# Patient Record
Sex: Male | Born: 1940 | Race: Black or African American | Hispanic: No | Marital: Married | Smoking: Never smoker
Health system: Southern US, Community
[De-identification: ages and names within clinical notes are randomized; demographics above are authoritative.]

## PROBLEM LIST (undated history)

## (undated) DIAGNOSIS — E119 Type 2 diabetes mellitus without complications: Secondary | ICD-10-CM

## (undated) DIAGNOSIS — N400 Enlarged prostate without lower urinary tract symptoms: Secondary | ICD-10-CM

## (undated) DIAGNOSIS — I10 Essential (primary) hypertension: Secondary | ICD-10-CM

## (undated) DIAGNOSIS — E7800 Pure hypercholesterolemia, unspecified: Secondary | ICD-10-CM

## (undated) HISTORY — PX: HERNIA REPAIR: SHX51

## (undated) HISTORY — PX: SHOULDER SURGERY: SHX246

---

## 2006-05-04 ENCOUNTER — Ambulatory Visit: Payer: Self-pay | Admitting: Cardiology

## 2006-06-02 ENCOUNTER — Ambulatory Visit (HOSPITAL_COMMUNITY): Admission: RE | Admit: 2006-06-02 | Discharge: 2006-06-03 | Payer: Self-pay | Admitting: Specialist

## 2014-12-04 ENCOUNTER — Emergency Department (HOSPITAL_COMMUNITY)
Admission: EM | Admit: 2014-12-04 | Discharge: 2014-12-04 | Disposition: A | Discharge status: Home / Self Care | Payer: Medicare Other | Attending: Emergency Medicine | Admitting: Emergency Medicine

## 2014-12-04 ENCOUNTER — Encounter (HOSPITAL_COMMUNITY): Payer: Self-pay | Admitting: Emergency Medicine

## 2014-12-04 ENCOUNTER — Emergency Department (HOSPITAL_COMMUNITY): Payer: Medicare Other

## 2014-12-04 DIAGNOSIS — I10 Essential (primary) hypertension: Secondary | ICD-10-CM | POA: Insufficient documentation

## 2014-12-04 DIAGNOSIS — Z7982 Long term (current) use of aspirin: Secondary | ICD-10-CM | POA: Diagnosis not present

## 2014-12-04 DIAGNOSIS — E119 Type 2 diabetes mellitus without complications: Secondary | ICD-10-CM | POA: Insufficient documentation

## 2014-12-04 DIAGNOSIS — R05 Cough: Secondary | ICD-10-CM | POA: Diagnosis present

## 2014-12-04 DIAGNOSIS — J40 Bronchitis, not specified as acute or chronic: Secondary | ICD-10-CM

## 2014-12-04 DIAGNOSIS — Z87448 Personal history of other diseases of urinary system: Secondary | ICD-10-CM | POA: Diagnosis not present

## 2014-12-04 DIAGNOSIS — Z79899 Other long term (current) drug therapy: Secondary | ICD-10-CM | POA: Insufficient documentation

## 2014-12-04 DIAGNOSIS — J209 Acute bronchitis, unspecified: Secondary | ICD-10-CM | POA: Insufficient documentation

## 2014-12-04 HISTORY — DX: Benign prostatic hyperplasia without lower urinary tract symptoms: N40.0

## 2014-12-04 HISTORY — DX: Pure hypercholesterolemia, unspecified: E78.00

## 2014-12-04 HISTORY — DX: Essential (primary) hypertension: I10

## 2014-12-04 HISTORY — DX: Type 2 diabetes mellitus without complications: E11.9

## 2014-12-04 LAB — COMPREHENSIVE METABOLIC PANEL
ALK PHOS: 59 U/L (ref 38–126)
ALT: 16 U/L — AB (ref 17–63)
ANION GAP: 9 (ref 5–15)
AST: 22 U/L (ref 15–41)
Albumin: 4.1 g/dL (ref 3.5–5.0)
BILIRUBIN TOTAL: 0.5 mg/dL (ref 0.3–1.2)
BUN: 14 mg/dL (ref 6–20)
CALCIUM: 8.7 mg/dL — AB (ref 8.9–10.3)
CO2: 27 mmol/L (ref 22–32)
CREATININE: 1.04 mg/dL (ref 0.61–1.24)
Chloride: 102 mmol/L (ref 101–111)
GFR calc Af Amer: >60 mL/min (ref >60)
GFR calc non Af Amer: >60 mL/min (ref >60)
GLUCOSE: 124 mg/dL — AB (ref 65–99)
POTASSIUM: 3.6 mmol/L (ref 3.5–5.1)
Sodium: 138 mmol/L (ref 135–145)
Total Protein: 7.3 g/dL (ref 6.5–8.1)

## 2014-12-04 LAB — CBC WITH DIFFERENTIAL/PLATELET
BASOS PCT: 0 % (ref 0–1)
Basophils Absolute: 0 10*3/uL (ref 0.0–0.1)
EOS PCT: 2 % (ref 0–5)
Eosinophils Absolute: 0.2 10*3/uL (ref 0.0–0.7)
HCT: 38.4 % — ABNORMAL LOW (ref 39.0–52.0)
Hemoglobin: 12.6 g/dL — ABNORMAL LOW (ref 13.0–17.0)
LYMPHS ABS: 0.5 10*3/uL — AB (ref 0.7–4.0)
LYMPHS PCT: 5 % — AB (ref 12–46)
MCH: 29.6 pg (ref 26.0–34.0)
MCHC: 32.8 g/dL (ref 30.0–36.0)
MCV: 90.1 fL (ref 78.0–100.0)
MONO ABS: 0.5 10*3/uL (ref 0.1–1.0)
MONOS PCT: 5 % (ref 3–12)
NEUTROS PCT: 88 % — AB (ref 43–77)
Neutro Abs: 9.5 10*3/uL — ABNORMAL HIGH (ref 1.7–7.7)
Platelets: 260 10*3/uL (ref 150–400)
RBC: 4.26 MIL/uL (ref 4.22–5.81)
RDW: 13.6 % (ref 11.5–15.5)
WBC: 10.7 10*3/uL — ABNORMAL HIGH (ref 4.0–10.5)

## 2014-12-04 LAB — CBG MONITORING, ED: GLUCOSE-CAPILLARY: 123 mg/dL — AB (ref 65–99)

## 2014-12-04 MED ORDER — ACETAMINOPHEN 500 MG PO TABS
1000.0000 mg | ORAL_TABLET | Freq: Once | ORAL | Status: AC
  Administered 2014-12-04: 1000 mg via ORAL
  Filled 2014-12-04: qty 2

## 2014-12-04 MED ORDER — SODIUM CHLORIDE 0.9 % IV BOLUS (SEPSIS)
1000.0000 mL | Freq: Once | INTRAVENOUS | Status: AC
  Administered 2014-12-04: 1000 mL via INTRAVENOUS

## 2014-12-04 MED ORDER — LEVOFLOXACIN 500 MG PO TABS: 500.0000 mg | ORAL_TABLET | Freq: Every day | ORAL | Status: DC

## 2014-12-04 MED ORDER — LEVOFLOXACIN 500 MG PO TABS
500.0000 mg | ORAL_TABLET | Freq: Once | ORAL | Status: AC
  Administered 2014-12-04: 500 mg via ORAL
  Filled 2014-12-04: qty 1

## 2014-12-04 NOTE — Discharge Instructions (Signed)
Drink plenty of fluids.  Tylenol or motrin for fever.  Follow up with your md in 2-3 days for recheck

## 2014-12-04 NOTE — ED Notes (Signed)
Pt states that he has been running a fever and coughing since last night.  No meds taken since 1000.

## 2014-12-04 NOTE — ED Provider Notes (Signed)
CSN: [id number]     Arrival date & time 5/Wayne/16  1559 History   First MD Initiated Contact with Patient 05/Wayne/16 1608     Chief Complaint  Patient presents with  . Fever  . Cough     (Consider location/radiation/quality/duration/timing/severity/associated sxs/prior Treatment) Patient is a 74 y.o. Calhoun presenting with fever and cough. The history is provided by the patient (the pt complains of cough and fever for 1-2 days).  Fever Temp source:  Oral Severity:  Moderate Onset quality:  Sudden Timing:  Unable to specify Chronicity:  New Relieved by:  None tried Worsened by:  Nothing tried Associated symptoms: cough   Associated symptoms: no chest pain, no congestion, no diarrhea, no headaches and no rash   Cough Associated symptoms: fever   Associated symptoms: no chest pain, no eye discharge, no headaches and no rash     Past Medical History  Diagnosis Date  . Diabetes mellitus without complication   . Hypertension   . Hypercholesteremia   . BPH (benign prostatic hyperplasia)    History reviewed. No pertinent past surgical history. History reviewed. No pertinent family history. History  Substance Use Topics  . Smoking status: Never Smoker   . Smokeless tobacco: Not on file  . Alcohol Use: No    Review of Systems  Constitutional: Positive for fever. Negative for appetite change and fatigue.  HENT: Negative for congestion, ear discharge and sinus pressure.   Eyes: Negative for discharge.  Respiratory: Positive for cough.   Cardiovascular: Negative for chest pain.  Gastrointestinal: Negative for abdominal pain and diarrhea.  Genitourinary: Negative for frequency and hematuria.  Musculoskeletal: Negative for back pain.  Skin: Negative for rash.  Neurological: Negative for seizures and headaches.  Psychiatric/Behavioral: Negative for hallucinations.      Allergies  Review of patient's allergies indicates no known allergies.  Home Medications   Prior to  Admission medications   Medication Sig Start Date End Date Taking? Authorizing Provider  aspirin EC 81 MG tablet Take 81 mg by mouth daily.   Yes Historical Provider, MD  B Complex Vitamins (B COMPLEX PO) Take 1 tablet by mouth daily.   Yes Historical Provider, MD  doxazosin (CARDURA) 4 MG tablet Take 4 mg by mouth at bedtime.   Yes Historical Provider, MD  glipiZIDE (GLUCOTROL XL) 2.5 MG 24 hr tablet Take 2.5 mg by mouth daily with breakfast.   Yes Historical Provider, MD  losartan-hydrochlorothiazide (HYZAAR) 100-25 MG per tablet Take 1 tablet by mouth daily.   Yes Historical Provider, MD  metFORMIN (GLUCOPHAGE-XR) 500 MG 24 hr tablet Take 2,000 mg by mouth daily.   Yes Historical Provider, MD  omeprazole (PRILOSEC) 20 MG capsule Take 20 mg by mouth daily.   Yes Historical Provider, MD  polyethylene glycol powder (GLYCOLAX/MIRALAX) powder Take 17 g by mouth daily as needed. FOR CONSTIPATION 10/02/14  Yes Historical Provider, MD  Tetrahydrozoline HCl (EYE DROPS OP) Apply 1 drop to eye daily as needed (FOR DRY EYE/IRRITATION).   Yes Historical Provider, MD  levofloxacin (LEVAQUIN) 500 MG tablet Take 1 tablet (500 mg total) by mouth daily. 5/Wayne/16   Bethann Berkshire, MD   BP 159/65 mmHg  Pulse 99  Temp(Src) 102.6 F (39.2 C) (Oral)  Resp Wayne  Ht  (1.651 m)  Wt 159 lb (72.122 kg)  BMI 26.46 kg/m2  SpO2 96% Physical Exam  Constitutional: He is oriented to person, place, and time. He appears well-developed.  HENT:  Head: Normocephalic.  Eyes: Conjunctivae and  EOM are normal. No scleral icterus.  Neck: Neck supple. No thyromegaly present.  Cardiovascular: Normal rate and regular rhythm.  Exam reveals no gallop and no friction rub.   No murmur heard. Pulmonary/Chest: No stridor. He has no wheezes. He has no rales. He exhibits no tenderness.  Abdominal: He exhibits no distension. There is no tenderness. There is no rebound.  Musculoskeletal: Normal range of motion. He exhibits no edema.   Lymphadenopathy:    He has no cervical adenopathy.  Neurological: He is oriented to person, place, and time. He exhibits normal muscle tone. Coordination normal.  Skin: No rash noted. No erythema.  Psychiatric: He has a normal mood and affect. His behavior is normal.    ED Course  Procedures (including critical care time) Labs Review Labs Reviewed  CBC WITH DIFFERENTIAL/PLATELET - Abnormal; Notable for the following:    WBC 10.7 (*)    Hemoglobin 12.6 (*)    HCT 38.4 (*)    Neutrophils Relative % 88 (*)    Neutro Abs 9.5 (*)    Lymphocytes Relative 5 (*)    Lymphs Abs 0.5 (*)    All other components within normal limits  COMPREHENSIVE METABOLIC PANEL - Abnormal; Notable for the following:    Glucose, Bld 124 (*)    Calcium 8.7 (*)    ALT 16 (*)    All other components within normal limits  CBG MONITORING, ED - Abnormal; Notable for the following:    Glucose-Capillary 123 (*)    All other components within normal limits    Imaging Review Dg Chest 2 View  5/Wayne/2016   CLINICAL DATA:  Weakness, body aches and productive cough with fever since yesterday. History of diabetes and hypertension.  EXAM: CHEST  2 VIEW  COMPARISON:  07/21/2014  FINDINGS: Cardiac silhouette normal in size. Normal mediastinal and hilar contours.  Lungs are mildly hyperexpanded but clear. No pleural effusion or pneumothorax.  Bony thorax is demineralized but grossly intact.  IMPRESSION: No active cardiopulmonary disease.   Electronically Signed   By: Amie Portlandavid  Ormond M.D.   On: 05/Wayne/2016 16:51     EKG Interpretation None      MDM   Final diagnoses:  Bronchitis    Bronchitis with fever.  tx with levaquin and follow up     Bethann BerkshireJoseph Roselynn Whitacre, MD 05/Wayne/16 [phone]

## 2014-12-04 NOTE — ED Notes (Signed)
MD Zammit at bedside. 

## 2015-08-24 DIAGNOSIS — M5432 Sciatica, left side: Secondary | ICD-10-CM | POA: Diagnosis not present

## 2015-08-30 DIAGNOSIS — M5440 Lumbago with sciatica, unspecified side: Secondary | ICD-10-CM | POA: Diagnosis not present

## 2015-08-30 DIAGNOSIS — S39012D Strain of muscle, fascia and tendon of lower back, subsequent encounter: Secondary | ICD-10-CM | POA: Diagnosis not present

## 2015-08-30 DIAGNOSIS — X58XXXD Exposure to other specified factors, subsequent encounter: Secondary | ICD-10-CM | POA: Diagnosis not present

## 2015-09-04 DIAGNOSIS — X58XXXD Exposure to other specified factors, subsequent encounter: Secondary | ICD-10-CM | POA: Diagnosis not present

## 2015-09-04 DIAGNOSIS — M5440 Lumbago with sciatica, unspecified side: Secondary | ICD-10-CM | POA: Diagnosis not present

## 2015-09-04 DIAGNOSIS — S39012D Strain of muscle, fascia and tendon of lower back, subsequent encounter: Secondary | ICD-10-CM | POA: Diagnosis not present

## 2015-09-05 DIAGNOSIS — E1122 Type 2 diabetes mellitus with diabetic chronic kidney disease: Secondary | ICD-10-CM | POA: Diagnosis not present

## 2015-09-05 DIAGNOSIS — H524 Presbyopia: Secondary | ICD-10-CM | POA: Diagnosis not present

## 2015-09-05 DIAGNOSIS — N182 Chronic kidney disease, stage 2 (mild): Secondary | ICD-10-CM | POA: Diagnosis not present

## 2015-09-05 DIAGNOSIS — Z0100 Encounter for examination of eyes and vision without abnormal findings: Secondary | ICD-10-CM | POA: Diagnosis not present

## 2015-09-05 DIAGNOSIS — I10 Essential (primary) hypertension: Secondary | ICD-10-CM | POA: Diagnosis not present

## 2015-09-05 DIAGNOSIS — E782 Mixed hyperlipidemia: Secondary | ICD-10-CM | POA: Diagnosis not present

## 2015-09-12 DIAGNOSIS — G4733 Obstructive sleep apnea (adult) (pediatric): Secondary | ICD-10-CM | POA: Diagnosis not present

## 2015-09-12 DIAGNOSIS — I10 Essential (primary) hypertension: Secondary | ICD-10-CM | POA: Diagnosis not present

## 2015-09-12 DIAGNOSIS — Z122 Encounter for screening for malignant neoplasm of respiratory organs: Secondary | ICD-10-CM | POA: Diagnosis not present

## 2015-09-12 DIAGNOSIS — E1122 Type 2 diabetes mellitus with diabetic chronic kidney disease: Secondary | ICD-10-CM | POA: Diagnosis not present

## 2015-09-12 DIAGNOSIS — E782 Mixed hyperlipidemia: Secondary | ICD-10-CM | POA: Diagnosis not present

## 2015-09-12 DIAGNOSIS — Z0001 Encounter for general adult medical examination with abnormal findings: Secondary | ICD-10-CM | POA: Diagnosis not present

## 2015-09-12 DIAGNOSIS — J301 Allergic rhinitis due to pollen: Secondary | ICD-10-CM | POA: Diagnosis not present

## 2015-09-12 DIAGNOSIS — N400 Enlarged prostate without lower urinary tract symptoms: Secondary | ICD-10-CM | POA: Diagnosis not present

## 2015-11-22 DIAGNOSIS — R350 Frequency of micturition: Secondary | ICD-10-CM | POA: Diagnosis not present

## 2015-11-22 DIAGNOSIS — M545 Low back pain: Secondary | ICD-10-CM | POA: Diagnosis not present

## 2016-01-17 DIAGNOSIS — E782 Mixed hyperlipidemia: Secondary | ICD-10-CM | POA: Diagnosis not present

## 2016-01-17 DIAGNOSIS — E1122 Type 2 diabetes mellitus with diabetic chronic kidney disease: Secondary | ICD-10-CM | POA: Diagnosis not present

## 2016-01-17 DIAGNOSIS — E119 Type 2 diabetes mellitus without complications: Secondary | ICD-10-CM | POA: Diagnosis not present

## 2016-01-17 DIAGNOSIS — N182 Chronic kidney disease, stage 2 (mild): Secondary | ICD-10-CM | POA: Diagnosis not present

## 2016-01-17 DIAGNOSIS — I10 Essential (primary) hypertension: Secondary | ICD-10-CM | POA: Diagnosis not present

## 2016-01-22 DIAGNOSIS — G4733 Obstructive sleep apnea (adult) (pediatric): Secondary | ICD-10-CM | POA: Diagnosis not present

## 2016-01-22 DIAGNOSIS — J301 Allergic rhinitis due to pollen: Secondary | ICD-10-CM | POA: Diagnosis not present

## 2016-01-22 DIAGNOSIS — M545 Low back pain: Secondary | ICD-10-CM | POA: Diagnosis not present

## 2016-01-22 DIAGNOSIS — I10 Essential (primary) hypertension: Secondary | ICD-10-CM | POA: Diagnosis not present

## 2016-01-22 DIAGNOSIS — E782 Mixed hyperlipidemia: Secondary | ICD-10-CM | POA: Diagnosis not present

## 2016-01-22 DIAGNOSIS — Z6829 Body mass index (BMI) 29.0-29.9, adult: Secondary | ICD-10-CM | POA: Diagnosis not present

## 2016-01-22 DIAGNOSIS — N400 Enlarged prostate without lower urinary tract symptoms: Secondary | ICD-10-CM | POA: Diagnosis not present

## 2016-01-22 DIAGNOSIS — E1122 Type 2 diabetes mellitus with diabetic chronic kidney disease: Secondary | ICD-10-CM | POA: Diagnosis not present

## 2016-01-28 DIAGNOSIS — M79604 Pain in right leg: Secondary | ICD-10-CM | POA: Diagnosis not present

## 2016-01-28 DIAGNOSIS — M5126 Other intervertebral disc displacement, lumbar region: Secondary | ICD-10-CM | POA: Diagnosis not present

## 2016-01-28 DIAGNOSIS — M79605 Pain in left leg: Secondary | ICD-10-CM | POA: Diagnosis not present

## 2016-01-28 DIAGNOSIS — M545 Low back pain: Secondary | ICD-10-CM | POA: Diagnosis not present

## 2016-02-01 DIAGNOSIS — N200 Calculus of kidney: Secondary | ICD-10-CM | POA: Diagnosis not present

## 2016-02-01 DIAGNOSIS — K5732 Diverticulitis of large intestine without perforation or abscess without bleeding: Secondary | ICD-10-CM | POA: Diagnosis not present

## 2016-02-01 DIAGNOSIS — R1084 Generalized abdominal pain: Secondary | ICD-10-CM | POA: Diagnosis not present

## 2016-02-01 DIAGNOSIS — K4090 Unilateral inguinal hernia, without obstruction or gangrene, not specified as recurrent: Secondary | ICD-10-CM | POA: Diagnosis not present

## 2016-02-04 DIAGNOSIS — N182 Chronic kidney disease, stage 2 (mild): Secondary | ICD-10-CM | POA: Diagnosis not present

## 2016-02-04 DIAGNOSIS — I10 Essential (primary) hypertension: Secondary | ICD-10-CM | POA: Diagnosis not present

## 2016-02-05 DIAGNOSIS — I10 Essential (primary) hypertension: Secondary | ICD-10-CM | POA: Diagnosis not present

## 2016-02-05 DIAGNOSIS — N182 Chronic kidney disease, stage 2 (mild): Secondary | ICD-10-CM | POA: Diagnosis not present

## 2016-03-03 DIAGNOSIS — M5416 Radiculopathy, lumbar region: Secondary | ICD-10-CM | POA: Diagnosis not present

## 2016-03-03 DIAGNOSIS — G629 Polyneuropathy, unspecified: Secondary | ICD-10-CM | POA: Diagnosis not present

## 2016-03-03 DIAGNOSIS — Z79899 Other long term (current) drug therapy: Secondary | ICD-10-CM | POA: Diagnosis not present

## 2016-03-03 DIAGNOSIS — G8929 Other chronic pain: Secondary | ICD-10-CM | POA: Diagnosis not present

## 2016-03-03 DIAGNOSIS — M5136 Other intervertebral disc degeneration, lumbar region: Secondary | ICD-10-CM | POA: Diagnosis not present

## 2016-03-25 DIAGNOSIS — M4727 Other spondylosis with radiculopathy, lumbosacral region: Secondary | ICD-10-CM | POA: Diagnosis not present

## 2016-03-25 DIAGNOSIS — M5136 Other intervertebral disc degeneration, lumbar region: Secondary | ICD-10-CM | POA: Diagnosis not present

## 2016-04-01 DIAGNOSIS — M4727 Other spondylosis with radiculopathy, lumbosacral region: Secondary | ICD-10-CM | POA: Diagnosis not present

## 2016-04-01 DIAGNOSIS — G8929 Other chronic pain: Secondary | ICD-10-CM | POA: Diagnosis not present

## 2016-04-01 DIAGNOSIS — M5136 Other intervertebral disc degeneration, lumbar region: Secondary | ICD-10-CM | POA: Diagnosis not present

## 2016-05-22 DIAGNOSIS — N182 Chronic kidney disease, stage 2 (mild): Secondary | ICD-10-CM | POA: Diagnosis not present

## 2016-05-22 DIAGNOSIS — E782 Mixed hyperlipidemia: Secondary | ICD-10-CM | POA: Diagnosis not present

## 2016-05-22 DIAGNOSIS — E1122 Type 2 diabetes mellitus with diabetic chronic kidney disease: Secondary | ICD-10-CM | POA: Diagnosis not present

## 2016-05-22 DIAGNOSIS — I10 Essential (primary) hypertension: Secondary | ICD-10-CM | POA: Diagnosis not present

## 2016-05-27 DIAGNOSIS — I10 Essential (primary) hypertension: Secondary | ICD-10-CM | POA: Diagnosis not present

## 2016-05-27 DIAGNOSIS — E782 Mixed hyperlipidemia: Secondary | ICD-10-CM | POA: Diagnosis not present

## 2016-05-27 DIAGNOSIS — J301 Allergic rhinitis due to pollen: Secondary | ICD-10-CM | POA: Diagnosis not present

## 2016-05-27 DIAGNOSIS — R072 Precordial pain: Secondary | ICD-10-CM | POA: Diagnosis not present

## 2016-05-27 DIAGNOSIS — Z23 Encounter for immunization: Secondary | ICD-10-CM | POA: Diagnosis not present

## 2016-05-27 DIAGNOSIS — G4733 Obstructive sleep apnea (adult) (pediatric): Secondary | ICD-10-CM | POA: Diagnosis not present

## 2016-05-27 DIAGNOSIS — E1122 Type 2 diabetes mellitus with diabetic chronic kidney disease: Secondary | ICD-10-CM | POA: Diagnosis not present

## 2016-05-27 DIAGNOSIS — N400 Enlarged prostate without lower urinary tract symptoms: Secondary | ICD-10-CM | POA: Diagnosis not present

## 2016-05-27 DIAGNOSIS — Z6828 Body mass index (BMI) 28.0-28.9, adult: Secondary | ICD-10-CM | POA: Diagnosis not present

## 2016-05-27 DIAGNOSIS — M545 Low back pain: Secondary | ICD-10-CM | POA: Diagnosis not present

## 2016-06-03 DIAGNOSIS — R079 Chest pain, unspecified: Secondary | ICD-10-CM | POA: Diagnosis not present

## 2016-06-03 DIAGNOSIS — R931 Abnormal findings on diagnostic imaging of heart and coronary circulation: Secondary | ICD-10-CM | POA: Diagnosis not present

## 2016-06-03 DIAGNOSIS — R0602 Shortness of breath: Secondary | ICD-10-CM | POA: Diagnosis not present

## 2016-06-16 DIAGNOSIS — M19011 Primary osteoarthritis, right shoulder: Secondary | ICD-10-CM | POA: Diagnosis not present

## 2016-06-16 DIAGNOSIS — Z6828 Body mass index (BMI) 28.0-28.9, adult: Secondary | ICD-10-CM | POA: Diagnosis not present

## 2016-06-16 DIAGNOSIS — S46011A Strain of muscle(s) and tendon(s) of the rotator cuff of right shoulder, initial encounter: Secondary | ICD-10-CM | POA: Diagnosis not present

## 2016-06-19 DIAGNOSIS — M75121 Complete rotator cuff tear or rupture of right shoulder, not specified as traumatic: Secondary | ICD-10-CM | POA: Diagnosis not present

## 2016-06-19 DIAGNOSIS — S46111A Strain of muscle, fascia and tendon of long head of biceps, right arm, initial encounter: Secondary | ICD-10-CM | POA: Diagnosis not present

## 2016-06-19 DIAGNOSIS — M25511 Pain in right shoulder: Secondary | ICD-10-CM | POA: Diagnosis not present

## 2016-06-19 DIAGNOSIS — M7581 Other shoulder lesions, right shoulder: Secondary | ICD-10-CM | POA: Diagnosis not present

## 2016-06-19 DIAGNOSIS — W19XXXA Unspecified fall, initial encounter: Secondary | ICD-10-CM | POA: Diagnosis not present

## 2016-07-01 DIAGNOSIS — M19011 Primary osteoarthritis, right shoulder: Secondary | ICD-10-CM | POA: Diagnosis not present

## 2016-07-01 DIAGNOSIS — M75121 Complete rotator cuff tear or rupture of right shoulder, not specified as traumatic: Secondary | ICD-10-CM | POA: Diagnosis not present

## 2016-07-01 DIAGNOSIS — S46211D Strain of muscle, fascia and tendon of other parts of biceps, right arm, subsequent encounter: Secondary | ICD-10-CM | POA: Diagnosis not present

## 2016-07-01 DIAGNOSIS — Z6828 Body mass index (BMI) 28.0-28.9, adult: Secondary | ICD-10-CM | POA: Diagnosis not present

## 2016-07-02 DIAGNOSIS — M25511 Pain in right shoulder: Secondary | ICD-10-CM | POA: Diagnosis not present

## 2016-07-02 DIAGNOSIS — S46011A Strain of muscle(s) and tendon(s) of the rotator cuff of right shoulder, initial encounter: Secondary | ICD-10-CM | POA: Diagnosis not present

## 2016-08-05 DIAGNOSIS — Y999 Unspecified external cause status: Secondary | ICD-10-CM | POA: Diagnosis not present

## 2016-08-05 DIAGNOSIS — S46111A Strain of muscle, fascia and tendon of long head of biceps, right arm, initial encounter: Secondary | ICD-10-CM | POA: Diagnosis not present

## 2016-08-05 DIAGNOSIS — S43421A Sprain of right rotator cuff capsule, initial encounter: Secondary | ICD-10-CM | POA: Diagnosis not present

## 2016-08-05 DIAGNOSIS — M19011 Primary osteoarthritis, right shoulder: Secondary | ICD-10-CM | POA: Diagnosis not present

## 2016-08-05 DIAGNOSIS — S46011A Strain of muscle(s) and tendon(s) of the rotator cuff of right shoulder, initial encounter: Secondary | ICD-10-CM | POA: Diagnosis not present

## 2016-08-05 DIAGNOSIS — G8918 Other acute postprocedural pain: Secondary | ICD-10-CM | POA: Diagnosis not present

## 2016-08-05 DIAGNOSIS — S46191A Other injury of muscle, fascia and tendon of long head of biceps, right arm, initial encounter: Secondary | ICD-10-CM | POA: Diagnosis not present

## 2016-08-05 DIAGNOSIS — S46001A Unspecified injury of muscle(s) and tendon(s) of the rotator cuff of right shoulder, initial encounter: Secondary | ICD-10-CM | POA: Diagnosis not present

## 2016-08-12 DIAGNOSIS — S46011D Strain of muscle(s) and tendon(s) of the rotator cuff of right shoulder, subsequent encounter: Secondary | ICD-10-CM | POA: Diagnosis not present

## 2016-08-13 DIAGNOSIS — J111 Influenza due to unidentified influenza virus with other respiratory manifestations: Secondary | ICD-10-CM | POA: Diagnosis not present

## 2016-08-13 DIAGNOSIS — Z6827 Body mass index (BMI) 27.0-27.9, adult: Secondary | ICD-10-CM | POA: Diagnosis not present

## 2016-08-13 DIAGNOSIS — J189 Pneumonia, unspecified organism: Secondary | ICD-10-CM | POA: Diagnosis not present

## 2016-08-15 ENCOUNTER — Emergency Department (HOSPITAL_COMMUNITY): Payer: Medicare HMO

## 2016-08-15 ENCOUNTER — Emergency Department (HOSPITAL_COMMUNITY)
Admission: EM | Admit: 2016-08-15 | Discharge: 2016-08-16 | Disposition: A | Discharge status: Home / Self Care | Payer: Medicare HMO | Attending: Emergency Medicine | Admitting: Emergency Medicine

## 2016-08-15 ENCOUNTER — Encounter (HOSPITAL_COMMUNITY): Payer: Self-pay | Admitting: Emergency Medicine

## 2016-08-15 DIAGNOSIS — R112 Nausea with vomiting, unspecified: Secondary | ICD-10-CM | POA: Diagnosis not present

## 2016-08-15 DIAGNOSIS — J111 Influenza due to unidentified influenza virus with other respiratory manifestations: Secondary | ICD-10-CM | POA: Insufficient documentation

## 2016-08-15 DIAGNOSIS — R059 Cough, unspecified: Secondary | ICD-10-CM

## 2016-08-15 DIAGNOSIS — I10 Essential (primary) hypertension: Secondary | ICD-10-CM | POA: Diagnosis not present

## 2016-08-15 DIAGNOSIS — E119 Type 2 diabetes mellitus without complications: Secondary | ICD-10-CM | POA: Insufficient documentation

## 2016-08-15 DIAGNOSIS — Z7984 Long term (current) use of oral hypoglycemic drugs: Secondary | ICD-10-CM | POA: Diagnosis not present

## 2016-08-15 DIAGNOSIS — Z7982 Long term (current) use of aspirin: Secondary | ICD-10-CM | POA: Insufficient documentation

## 2016-08-15 DIAGNOSIS — R05 Cough: Secondary | ICD-10-CM | POA: Diagnosis not present

## 2016-08-15 DIAGNOSIS — Z79899 Other long term (current) drug therapy: Secondary | ICD-10-CM | POA: Diagnosis not present

## 2016-08-15 MED ORDER — ONDANSETRON 4 MG PO TBDP
4.0000 mg | ORAL_TABLET | Freq: Once | ORAL | Status: AC
  Administered 2016-08-15: 4 mg via ORAL
  Filled 2016-08-15: qty 1

## 2016-08-15 MED ORDER — ONDANSETRON HCL 4 MG/2ML IJ SOLN
4.0000 mg | Freq: Once | INTRAMUSCULAR | Status: AC
  Administered 2016-08-15: 4 mg via INTRAVENOUS
  Filled 2016-08-15: qty 2

## 2016-08-15 MED ORDER — SODIUM CHLORIDE 0.9 % IV BOLUS (SEPSIS)
1000.0000 mL | Freq: Once | INTRAVENOUS | Status: AC
  Administered 2016-08-15: 1000 mL via INTRAVENOUS

## 2016-08-15 MED ORDER — SODIUM CHLORIDE 0.9 % IV BOLUS (SEPSIS)
500.0000 mL | Freq: Once | INTRAVENOUS | Status: AC
  Administered 2016-08-16: 500 mL via INTRAVENOUS

## 2016-08-15 NOTE — ED Triage Notes (Signed)
Seen by Pa at Dr Garner Nashaniels office- given Levoquin 750 and dx'd with flu and pneumonia

## 2016-08-15 NOTE — ED Notes (Addendum)
Patient's wife states that patient has been vomiting x2days. Not able to eat. Patient states that when he coughs it makes the burning worse in his stomach.

## 2016-08-15 NOTE — ED Provider Notes (Signed)
[facility]-EMERGENCY DEPT Provider Note   CSN: [id number] Arrival date & time: 08/15/16  1659   By signing my name below, I, Wayne Calhoun, attest that this documentation has been prepared under the direction and in the presence of Wayne Albe, MD . Electronically Signed: Teofilo Calhoun, ED Scribe. 08/15/2016. 11:21 PM.  Patient seen 2308 PM    History   Chief Complaint Chief Complaint  Patient presents with  . Influenza    since Sunday - seen by PA Dx with flu and pneumonia   The history is provided by the patient. No language interpreter was used.   HPI Comments:  Wayne Calhoun is a 76 y.o. male with PMHx of DM who presents to the Emergency Department complaining of a persistent productive cough since Feb 4. Pt states that his cough has had white sputum. Per wife, pt has been vomiting (15x today, 30x in the past 48 hours) , and has had a fever of 101, bilateral lower abdominal pain when he coughs, chills, decreased urination. Pt describes the abdominal pain as "burning." Wife states that pt has been having difficulty walking today. Pt was seen by Dr. Verl Bangs the 7th  and diagnosed with flu and pneumonia. He was started on Tamiflu and Levaquin. No alleviating factors noted. Pt denies diarrhea, states his last BM was Feb 5. He was also started on Keflex, oxycodone, and baclofen.  PCP [facility]   Past Medical History:  Diagnosis Date  . BPH (benign prostatic hyperplasia)   . Diabetes mellitus without complication (HCC)   . Hypercholesteremia   . Hypertension     There are no active problems to display for this patient.   History reviewed. No pertinent surgical history.     Home Medications    Prior to Admission medications   Medication Sig Start Date End Date Taking? Authorizing Provider  acetaminophen (TYLENOL) 500 MG tablet Take 500 mg by mouth every 6 (six) hours as needed for mild pain or moderate pain.   Yes Historical Provider, MD  alum & mag  hydroxide-simeth (MAALOX/MYLANTA) 200-200-20 MG/5ML suspension Take 15 mLs by mouth every 6 (six) hours as needed for indigestion or heartburn.   Yes Historical Provider, MD  aspirin EC 81 MG tablet Take 81 mg by mouth daily.   Yes Historical Provider, MD  baclofen (LIORESAL) 10 MG tablet Take 10 mg by mouth every 8 (eight) hours as needed for muscle spasms.   Yes Historical Provider, MD  bisacodyl (DULCOLAX) 10 MG suppository Place 10 mg rectally as needed for moderate constipation.   Yes Historical Provider, MD  cephALEXin (KEFLEX) 500 MG capsule Take 500 mg by mouth 3 (three) times daily. 4 day  Course starting on 08/06/2016   Yes Historical Provider, MD  glipiZIDE (GLUCOTROL XL) 5 MG 24 hr tablet Take 5 mg by mouth daily with breakfast.    Yes Historical Provider, MD  levofloxacin (LEVAQUIN) 750 MG tablet Take 375 mg by mouth 3 (three) times daily. 5 day course starting on 08/13/2016   Yes Historical Provider, MD  losartan-hydrochlorothiazide (HYZAAR) 100-25 MG per tablet Take 1 tablet by mouth daily.   Yes Historical Provider, MD  lovastatin (MEVACOR) 20 MG tablet Take 20 mg by mouth at bedtime.   Yes Historical Provider, MD  metFORMIN (GLUCOPHAGE-XR) 500 MG 24 hr tablet Take 2,000 mg by mouth daily.   Yes Historical Provider, MD  omeprazole (PRILOSEC) 20 MG capsule Take 20 mg by mouth daily.   Yes Historical Provider,  MD  oseltamivir (TAMIFLU) 75 MG capsule Take 75 mg by mouth 2 (two) times daily. 5 day course starting on 08/13/2016   Yes Historical Provider, MD  oxycodone (OXY-IR) 5 MG capsule Take 2.5-5 mg by mouth every 4 (four) hours as needed for pain.    Yes Historical Provider, MD  polyethylene glycol powder (GLYCOLAX/MIRALAX) powder Take 17 g by mouth daily as needed. FOR CONSTIPATION 10/02/14  Yes Historical Provider, MD  tamsulosin (FLOMAX) 0.4 MG CAPS capsule Take 0.4 mg by mouth at bedtime.   Yes Historical Provider, MD  Tetrahydrozoline HCl (EYE DROPS OP) Apply 1 drop to eye daily as  needed (FOR DRY EYE/IRRITATION).   Yes Historical Provider, MD  ondansetron (ZOFRAN ODT) 8 MG disintegrating tablet Take 1 tablet (8 mg total) by mouth every 8 (eight) hours as needed for nausea or vomiting. 08/16/16   Wayne AlbeIva Maykayla Highley, MD    Family History History reviewed. No pertinent family history.  Social History Social History  Substance Use Topics  . Smoking status: Never Smoker  . Smokeless tobacco: Never Used  . Alcohol use No  lives at home Lives with spouse   Allergies   Patient has no known allergies.   Review of Systems Review of Systems  Constitutional: Positive for chills and fever.  Respiratory: Positive for cough.   Gastrointestinal: Positive for abdominal pain, nausea and vomiting. Negative for diarrhea.  Genitourinary: Positive for decreased urine volume.  All other systems reviewed and are negative.    Physical Exam Updated Vital Signs BP 123/73 (BP Location: Left Arm)   Pulse 74   Temp 98.2 F (36.8 C) (Oral)   Resp 20   Ht 5\' 5"  (1.651 m)   Wt 170 lb (77.1 kg)   SpO2 97%   BMI 28.29 kg/m   Physical Exam  Constitutional: He is oriented to person, place, and time. He appears well-developed and well-nourished.  Non-toxic appearance. He does not appear ill. No distress.  HENT:  Head: Normocephalic and atraumatic.  Right Ear: External ear normal.  Left Ear: External ear normal.  Nose: Nose normal. No mucosal edema or rhinorrhea.  Mouth/Throat: Mucous membranes are normal. No dental abscesses or uvula swelling.  Tongue mildly dry.   Eyes: Conjunctivae and EOM are normal. Pupils are equal, round, and reactive to light.  Neck: Normal range of motion and full passive range of motion without pain. Neck supple.  Cardiovascular: Normal rate, regular rhythm and normal heart sounds.  Exam reveals no gallop and no friction rub.   No murmur heard. Pulmonary/Chest: Effort normal and breath sounds normal. No respiratory distress. He has no wheezes. He has no  rhonchi. He has no rales. He exhibits no tenderness and no crepitus.  Abdominal: Soft. Normal appearance and bowel sounds are normal. He exhibits no distension. There is no tenderness. There is no rebound and no guarding.  Musculoskeletal: Normal range of motion. He exhibits no edema or tenderness.  Moves all extremities well except his RUE and he is in a immobilizer (just had rotator cuff surgery).   Neurological: He is alert and oriented to person, place, and time. He has normal strength. No cranial nerve deficit.  Skin: Skin is warm, dry and intact. No rash noted. No erythema. No pallor.  Psychiatric: He has a normal mood and affect. His speech is normal and behavior is normal. His mood appears not anxious.  Nursing note and vitals reviewed.    ED Treatments / Results  Labs (all labs ordered are listed,  but only abnormal results are displayed) Results for orders placed or performed during the hospital encounter of 08/15/16  Culture, blood (routine x 2)  Result Value Ref Range   Specimen Description LEFT ANTECUBITAL    Special Requests BOTTLES DRAWN AEROBIC AND ANAEROBIC 7CC    Culture PENDING    Report Status PENDING   Culture, blood (routine x 2)  Result Value Ref Range   Specimen Description BLOOD LEFT HAND    Special Requests BOTTLES DRAWN AEROBIC AND ANAEROBIC 6CC    Culture PENDING    Report Status PENDING   Comprehensive metabolic panel  Result Value Ref Range   Sodium 137 135 - 145 mmol/L   Potassium 3.9 3.5 - 5.1 mmol/L   Chloride 96 (L) 101 - 111 mmol/L   CO2 29 22 - 32 mmol/L   Glucose, Bld 191 (H) 65 - 99 mg/dL   BUN 19 6 - 20 mg/dL   Creatinine, Ser 1.61 0.61 - 1.24 mg/dL   Calcium 9.5 8.9 - 09.6 mg/dL   Total Protein 7.3 6.5 - 8.1 g/dL   Albumin 3.7 3.5 - 5.0 g/dL   AST 17 15 - 41 U/L   ALT 18 17 - 63 U/L   Alkaline Phosphatase 77 38 - 126 U/L   Total Bilirubin 0.3 0.3 - 1.2 mg/dL   GFR calc non Af Amer >60 >60 mL/min   GFR calc Af Amer >60 >60 mL/min    Anion gap 12 5 - 15  CBC with Differential  Result Value Ref Range   WBC 6.4 4.0 - 10.5 K/uL   RBC 4.45 4.22 - 5.81 MIL/uL   Hemoglobin 13.3 13.0 - 17.0 g/dL   HCT 04.5 40.9 - 81.1 %   MCV 88.3 78.0 - 100.0 fL   MCH 29.9 26.0 - 34.0 pg   MCHC 33.8 30.0 - 36.0 g/dL   RDW 91.4 78.2 - 95.6 %   Platelets 364 150 - 400 K/uL   Neutrophils Relative % 58 %   Neutro Abs 3.7 1.7 - 7.7 K/uL   Lymphocytes Relative 29 %   Lymphs Abs 1.8 0.7 - 4.0 K/uL   Monocytes Relative 11 %   Monocytes Absolute 0.7 0.1 - 1.0 K/uL   Eosinophils Relative 2 %   Eosinophils Absolute 0.1 0.0 - 0.7 K/uL   Basophils Relative 0 %   Basophils Absolute 0.0 0.0 - 0.1 K/uL  Lipase, blood  Result Value Ref Range   Lipase 15 11 - 51 U/L  Urinalysis, Routine w reflex microscopic  Result Value Ref Range   Color, Urine YELLOW YELLOW   APPearance CLEAR CLEAR   Specific Gravity, Urine 1.024 1.005 - 1.030   pH 7.0 5.0 - 8.0   Glucose, UA NEGATIVE NEGATIVE mg/dL   Hgb urine dipstick NEGATIVE NEGATIVE   Bilirubin Urine NEGATIVE NEGATIVE   Ketones, ur NEGATIVE NEGATIVE mg/dL   Protein, ur NEGATIVE NEGATIVE mg/dL   Nitrite NEGATIVE NEGATIVE   Leukocytes, UA NEGATIVE NEGATIVE   Laboratory interpretation all normal except hyperglycemia    EKG  EKG Interpretation None       Radiology Dg Chest 1 View  Result Date: 08/15/2016 CLINICAL DATA:  Cough and fever EXAM: CHEST 1 VIEW COMPARISON:  08/13/2016 CXR FINDINGS: The heart size and mediastinal contours are within normal limits. Both lungs are clear. The no pulmonary consolidation, CHF nor effusion. Minimal atelectasis at the left base. Osteoarthritis of right AC joint. No suspicious osseous abnormality. IMPRESSION: No active disease. Electronically Signed  By: Tollie Eth M.D.   On: 08/15/2016 20:16    Procedures Procedures (including critical care time)  Medications Ordered in ED Medications  ondansetron (ZOFRAN-ODT) disintegrating tablet 4 mg (4 mg Oral Given  08/15/16 1739)  sodium chloride 0.9 % bolus 1,000 mL (0 mLs Intravenous Stopped 08/16/16 0107)  sodium chloride 0.9 % bolus 500 mL (0 mLs Intravenous Stopped 08/16/16 0128)  ondansetron (ZOFRAN) injection 4 mg (4 mg Intravenous Given 08/15/16 2352)  sodium chloride 0.9 % bolus 1,000 mL (0 mLs Intravenous Stopped 08/16/16 0339)  ondansetron (ZOFRAN) injection 4 mg (4 mg Intravenous Given 08/16/16 0357)     Initial Impression / Assessment and Plan / ED Course  I have reviewed the triage vital signs and the nursing notes.  Pertinent labs & imaging results that were available during my care of the patient were reviewed by me and considered in my medical decision making (see chart for details).  DIAGNOSTIC STUDIES:  Oxygen Saturation is 97% on RA, normal by my interpretation.    COORDINATION OF CARE:  11:21 PM Will order lab word, IV fluids, and IV nausea medication. Discussed treatment plan with pt at bedside and pt agreed to plan.   Recheck at 1:30 AM patient is doing well. He is drinking oral fluids.  3 AM nursing staff went to ambulate patient however when he stood up he started adding dry heaves. He was given more nausea medication.  At time of discharge patient to drink more fluids. His nausea was gone. He was able to angulate to the bathroom and had no more episodes of dry heaving or vomiting. At this point is to go home and finished the medication he already started this week by his PCP.  Final Clinical Impressions(s) / ED Diagnoses   Final diagnoses:  Influenza  Non-intractable vomiting with nausea, unspecified vomiting type    New Prescriptions New Prescriptions   ONDANSETRON (ZOFRAN ODT) 8 MG DISINTEGRATING TABLET    Take 1 tablet (8 mg total) by mouth every 8 (eight) hours as needed for nausea or vomiting.   Plan discharge  Wayne Albe, MD, FACEP   I personally performed the services described in this documentation, which was scribed in my presence. The recorded information  has been reviewed and considered.  Wayne Albe, MD, Concha Pyo, MD 08/16/16 [phone]

## 2016-08-16 LAB — CBC WITH DIFFERENTIAL/PLATELET
Basophils Absolute: 0 10*3/uL (ref 0.0–0.1)
Basophils Relative: 0 %
EOS ABS: 0.1 10*3/uL (ref 0.0–0.7)
EOS PCT: 2 %
HCT: 39.3 % (ref 39.0–52.0)
HEMOGLOBIN: 13.3 g/dL (ref 13.0–17.0)
LYMPHS ABS: 1.8 10*3/uL (ref 0.7–4.0)
LYMPHS PCT: 29 %
MCH: 29.9 pg (ref 26.0–34.0)
MCHC: 33.8 g/dL (ref 30.0–36.0)
MCV: 88.3 fL (ref 78.0–100.0)
MONOS PCT: 11 %
Monocytes Absolute: 0.7 10*3/uL (ref 0.1–1.0)
Neutro Abs: 3.7 10*3/uL (ref 1.7–7.7)
Neutrophils Relative %: 58 %
PLATELETS: 364 10*3/uL (ref 150–400)
RBC: 4.45 MIL/uL (ref 4.22–5.81)
RDW: 12.8 % (ref 11.5–15.5)
WBC: 6.4 10*3/uL (ref 4.0–10.5)

## 2016-08-16 LAB — URINALYSIS, ROUTINE W REFLEX MICROSCOPIC
Bilirubin Urine: NEGATIVE
Glucose, UA: NEGATIVE mg/dL
Hgb urine dipstick: NEGATIVE
KETONES UR: NEGATIVE mg/dL
Leukocytes, UA: NEGATIVE
NITRITE: NEGATIVE
PROTEIN: NEGATIVE mg/dL
SPECIFIC GRAVITY, URINE: 1.024 (ref 1.005–1.030)
pH: 7 (ref 5.0–8.0)

## 2016-08-16 LAB — COMPREHENSIVE METABOLIC PANEL
ALBUMIN: 3.7 g/dL (ref 3.5–5.0)
ALT: 18 U/L (ref 17–63)
AST: 17 U/L (ref 15–41)
Alkaline Phosphatase: 77 U/L (ref 38–126)
Anion gap: 12 (ref 5–15)
BUN: 19 mg/dL (ref 6–20)
CHLORIDE: 96 mmol/L — AB (ref 101–111)
CO2: 29 mmol/L (ref 22–32)
CREATININE: 1.12 mg/dL (ref 0.61–1.24)
Calcium: 9.5 mg/dL (ref 8.9–10.3)
GFR calc Af Amer: >60 mL/min (ref >60)
GFR calc non Af Amer: >60 mL/min (ref >60)
Glucose, Bld: 191 mg/dL — ABNORMAL HIGH (ref 65–99)
POTASSIUM: 3.9 mmol/L (ref 3.5–5.1)
SODIUM: 137 mmol/L (ref 135–145)
Total Bilirubin: 0.3 mg/dL (ref 0.3–1.2)
Total Protein: 7.3 g/dL (ref 6.5–8.1)

## 2016-08-16 LAB — LIPASE, BLOOD: LIPASE: 15 U/L (ref 11–51)

## 2016-08-16 MED ORDER — ONDANSETRON 8 MG PO TBDP: 8.0000 mg | ORAL_TABLET | Freq: Three times a day (TID) | ORAL | 0 refills | Status: DC | PRN

## 2016-08-16 MED ORDER — ONDANSETRON HCL 4 MG/2ML IJ SOLN
4.0000 mg | Freq: Once | INTRAMUSCULAR | Status: AC
  Administered 2016-08-16: 4 mg via INTRAVENOUS
  Filled 2016-08-16: qty 2

## 2016-08-16 MED ORDER — SODIUM CHLORIDE 0.9 % IV BOLUS (SEPSIS)
1000.0000 mL | Freq: Once | INTRAVENOUS | Status: AC
  Administered 2016-08-16: 1000 mL via INTRAVENOUS

## 2016-08-16 NOTE — ED Notes (Signed)
Patient able to ambulate around the ER with standby assist. Patient states that he is pain free and is not nauseated any more.

## 2016-08-16 NOTE — Discharge Instructions (Signed)
Drink plenty of fluids. Continue your Tamiflu and antibiotics. Use the zofran for nausea or vomiting. Recheck if you get dehydrated again or struggle to breathe or feel worse.

## 2016-08-16 NOTE — ED Notes (Signed)
Pt given ginger ale with no vomiting

## 2016-08-16 NOTE — ED Notes (Signed)
Pt states he is feeling much better and hungry

## 2016-08-16 NOTE — ED Notes (Signed)
Pt states understanding of care given and follow up instructions.  Pt ambulated from the ED, steady gait, a/o with significant other

## 2016-08-16 NOTE — ED Notes (Signed)
Went in to ambulate patient per EDP. Upon standing patient stated that he felt like he was going to throw up. Sit patient back on bed, patient dry heaving at this time. RN Baird LyonsCasey is aware of patient status at this time.

## 2016-08-18 DIAGNOSIS — J4531 Mild persistent asthma with (acute) exacerbation: Secondary | ICD-10-CM | POA: Diagnosis not present

## 2016-08-18 DIAGNOSIS — Z6827 Body mass index (BMI) 27.0-27.9, adult: Secondary | ICD-10-CM | POA: Diagnosis not present

## 2016-08-21 LAB — CULTURE, BLOOD (ROUTINE X 2)
CULTURE: NO GROWTH
Culture: NO GROWTH

## 2016-08-29 DIAGNOSIS — M25511 Pain in right shoulder: Secondary | ICD-10-CM | POA: Diagnosis not present

## 2016-08-29 DIAGNOSIS — Z4789 Encounter for other orthopedic aftercare: Secondary | ICD-10-CM | POA: Diagnosis not present

## 2016-09-05 DIAGNOSIS — M25511 Pain in right shoulder: Secondary | ICD-10-CM | POA: Diagnosis not present

## 2016-09-22 DIAGNOSIS — E119 Type 2 diabetes mellitus without complications: Secondary | ICD-10-CM | POA: Diagnosis not present

## 2016-09-22 DIAGNOSIS — E782 Mixed hyperlipidemia: Secondary | ICD-10-CM | POA: Diagnosis not present

## 2016-09-22 DIAGNOSIS — I10 Essential (primary) hypertension: Secondary | ICD-10-CM | POA: Diagnosis not present

## 2016-09-22 DIAGNOSIS — N182 Chronic kidney disease, stage 2 (mild): Secondary | ICD-10-CM | POA: Diagnosis not present

## 2016-09-25 DIAGNOSIS — E1122 Type 2 diabetes mellitus with diabetic chronic kidney disease: Secondary | ICD-10-CM | POA: Diagnosis not present

## 2016-09-25 DIAGNOSIS — M545 Low back pain: Secondary | ICD-10-CM | POA: Diagnosis not present

## 2016-09-25 DIAGNOSIS — N400 Enlarged prostate without lower urinary tract symptoms: Secondary | ICD-10-CM | POA: Diagnosis not present

## 2016-09-25 DIAGNOSIS — G4733 Obstructive sleep apnea (adult) (pediatric): Secondary | ICD-10-CM | POA: Diagnosis not present

## 2016-09-25 DIAGNOSIS — I10 Essential (primary) hypertension: Secondary | ICD-10-CM | POA: Diagnosis not present

## 2016-09-25 DIAGNOSIS — J301 Allergic rhinitis due to pollen: Secondary | ICD-10-CM | POA: Diagnosis not present

## 2016-09-25 DIAGNOSIS — E782 Mixed hyperlipidemia: Secondary | ICD-10-CM | POA: Diagnosis not present

## 2016-10-07 DIAGNOSIS — M25511 Pain in right shoulder: Secondary | ICD-10-CM | POA: Diagnosis not present

## 2016-10-09 DIAGNOSIS — M25511 Pain in right shoulder: Secondary | ICD-10-CM | POA: Diagnosis not present

## 2016-10-14 DIAGNOSIS — M25511 Pain in right shoulder: Secondary | ICD-10-CM | POA: Diagnosis not present

## 2016-10-16 DIAGNOSIS — M25511 Pain in right shoulder: Secondary | ICD-10-CM | POA: Diagnosis not present

## 2016-10-21 DIAGNOSIS — M25511 Pain in right shoulder: Secondary | ICD-10-CM | POA: Diagnosis not present

## 2016-10-23 DIAGNOSIS — M25511 Pain in right shoulder: Secondary | ICD-10-CM | POA: Diagnosis not present

## 2016-10-28 DIAGNOSIS — M25511 Pain in right shoulder: Secondary | ICD-10-CM | POA: Diagnosis not present

## 2016-10-31 DIAGNOSIS — M25511 Pain in right shoulder: Secondary | ICD-10-CM | POA: Diagnosis not present

## 2016-11-04 DIAGNOSIS — M25511 Pain in right shoulder: Secondary | ICD-10-CM | POA: Diagnosis not present

## 2016-12-03 DIAGNOSIS — S46011D Strain of muscle(s) and tendon(s) of the rotator cuff of right shoulder, subsequent encounter: Secondary | ICD-10-CM | POA: Diagnosis not present

## 2016-12-03 DIAGNOSIS — Z4789 Encounter for other orthopedic aftercare: Secondary | ICD-10-CM | POA: Diagnosis not present

## 2017-01-12 DIAGNOSIS — Z6828 Body mass index (BMI) 28.0-28.9, adult: Secondary | ICD-10-CM | POA: Diagnosis not present

## 2017-01-12 DIAGNOSIS — E1122 Type 2 diabetes mellitus with diabetic chronic kidney disease: Secondary | ICD-10-CM | POA: Diagnosis not present

## 2017-01-12 DIAGNOSIS — Z9189 Other specified personal risk factors, not elsewhere classified: Secondary | ICD-10-CM | POA: Diagnosis not present

## 2017-01-12 DIAGNOSIS — I10 Essential (primary) hypertension: Secondary | ICD-10-CM | POA: Diagnosis not present

## 2017-01-12 DIAGNOSIS — Z0001 Encounter for general adult medical examination with abnormal findings: Secondary | ICD-10-CM | POA: Diagnosis not present

## 2017-01-12 DIAGNOSIS — N182 Chronic kidney disease, stage 2 (mild): Secondary | ICD-10-CM | POA: Diagnosis not present

## 2017-01-12 DIAGNOSIS — G4733 Obstructive sleep apnea (adult) (pediatric): Secondary | ICD-10-CM | POA: Diagnosis not present

## 2017-01-12 DIAGNOSIS — E782 Mixed hyperlipidemia: Secondary | ICD-10-CM | POA: Diagnosis not present

## 2017-01-14 DIAGNOSIS — S46011D Strain of muscle(s) and tendon(s) of the rotator cuff of right shoulder, subsequent encounter: Secondary | ICD-10-CM | POA: Diagnosis not present

## 2017-01-14 DIAGNOSIS — Z4789 Encounter for other orthopedic aftercare: Secondary | ICD-10-CM | POA: Diagnosis not present

## 2017-01-15 DIAGNOSIS — E782 Mixed hyperlipidemia: Secondary | ICD-10-CM | POA: Diagnosis not present

## 2017-01-15 DIAGNOSIS — Z6828 Body mass index (BMI) 28.0-28.9, adult: Secondary | ICD-10-CM | POA: Diagnosis not present

## 2017-01-15 DIAGNOSIS — E1122 Type 2 diabetes mellitus with diabetic chronic kidney disease: Secondary | ICD-10-CM | POA: Diagnosis not present

## 2017-01-15 DIAGNOSIS — M545 Low back pain: Secondary | ICD-10-CM | POA: Diagnosis not present

## 2017-01-15 DIAGNOSIS — I10 Essential (primary) hypertension: Secondary | ICD-10-CM | POA: Diagnosis not present

## 2017-01-15 DIAGNOSIS — Z0001 Encounter for general adult medical examination with abnormal findings: Secondary | ICD-10-CM | POA: Diagnosis not present

## 2017-01-15 DIAGNOSIS — Z1212 Encounter for screening for malignant neoplasm of rectum: Secondary | ICD-10-CM | POA: Diagnosis not present

## 2017-01-15 DIAGNOSIS — N400 Enlarged prostate without lower urinary tract symptoms: Secondary | ICD-10-CM | POA: Diagnosis not present

## 2017-02-05 DIAGNOSIS — Z972 Presence of dental prosthetic device (complete) (partial): Secondary | ICD-10-CM | POA: Diagnosis not present

## 2017-02-05 DIAGNOSIS — Z7982 Long term (current) use of aspirin: Secondary | ICD-10-CM | POA: Diagnosis not present

## 2017-02-05 DIAGNOSIS — I10 Essential (primary) hypertension: Secondary | ICD-10-CM | POA: Diagnosis not present

## 2017-02-05 DIAGNOSIS — Z7984 Long term (current) use of oral hypoglycemic drugs: Secondary | ICD-10-CM | POA: Diagnosis not present

## 2017-02-05 DIAGNOSIS — K219 Gastro-esophageal reflux disease without esophagitis: Secondary | ICD-10-CM | POA: Diagnosis not present

## 2017-02-05 DIAGNOSIS — Z6828 Body mass index (BMI) 28.0-28.9, adult: Secondary | ICD-10-CM | POA: Diagnosis not present

## 2017-02-05 DIAGNOSIS — Z0000 Encounter for general adult medical examination without abnormal findings: Secondary | ICD-10-CM | POA: Diagnosis not present

## 2017-02-05 DIAGNOSIS — E119 Type 2 diabetes mellitus without complications: Secondary | ICD-10-CM | POA: Diagnosis not present

## 2017-02-05 DIAGNOSIS — N401 Enlarged prostate with lower urinary tract symptoms: Secondary | ICD-10-CM | POA: Diagnosis not present

## 2017-02-05 DIAGNOSIS — E785 Hyperlipidemia, unspecified: Secondary | ICD-10-CM | POA: Diagnosis not present

## 2017-03-09 DIAGNOSIS — Z6827 Body mass index (BMI) 27.0-27.9, adult: Secondary | ICD-10-CM | POA: Diagnosis not present

## 2017-03-09 DIAGNOSIS — R69 Illness, unspecified: Secondary | ICD-10-CM | POA: Diagnosis not present

## 2017-03-09 DIAGNOSIS — R0602 Shortness of breath: Secondary | ICD-10-CM | POA: Diagnosis not present

## 2017-03-09 DIAGNOSIS — R531 Weakness: Secondary | ICD-10-CM | POA: Diagnosis not present

## 2017-03-09 DIAGNOSIS — R300 Dysuria: Secondary | ICD-10-CM | POA: Diagnosis not present

## 2017-03-09 DIAGNOSIS — R42 Dizziness and giddiness: Secondary | ICD-10-CM | POA: Diagnosis not present

## 2017-03-13 DIAGNOSIS — T63441A Toxic effect of venom of bees, accidental (unintentional), initial encounter: Secondary | ICD-10-CM | POA: Diagnosis not present

## 2017-04-02 DIAGNOSIS — E782 Mixed hyperlipidemia: Secondary | ICD-10-CM | POA: Diagnosis not present

## 2017-04-02 DIAGNOSIS — Z23 Encounter for immunization: Secondary | ICD-10-CM | POA: Diagnosis not present

## 2017-04-02 DIAGNOSIS — E1122 Type 2 diabetes mellitus with diabetic chronic kidney disease: Secondary | ICD-10-CM | POA: Diagnosis not present

## 2017-04-02 DIAGNOSIS — M545 Low back pain: Secondary | ICD-10-CM | POA: Diagnosis not present

## 2017-04-02 DIAGNOSIS — N400 Enlarged prostate without lower urinary tract symptoms: Secondary | ICD-10-CM | POA: Diagnosis not present

## 2017-04-02 DIAGNOSIS — Z6827 Body mass index (BMI) 27.0-27.9, adult: Secondary | ICD-10-CM | POA: Diagnosis not present

## 2017-04-02 DIAGNOSIS — I10 Essential (primary) hypertension: Secondary | ICD-10-CM | POA: Diagnosis not present

## 2017-04-02 DIAGNOSIS — Z1212 Encounter for screening for malignant neoplasm of rectum: Secondary | ICD-10-CM | POA: Diagnosis not present

## 2017-04-10 DIAGNOSIS — R6883 Chills (without fever): Secondary | ICD-10-CM | POA: Diagnosis not present

## 2017-04-10 DIAGNOSIS — E7800 Pure hypercholesterolemia, unspecified: Secondary | ICD-10-CM | POA: Diagnosis not present

## 2017-04-10 DIAGNOSIS — R109 Unspecified abdominal pain: Secondary | ICD-10-CM | POA: Diagnosis not present

## 2017-04-10 DIAGNOSIS — K5732 Diverticulitis of large intestine without perforation or abscess without bleeding: Secondary | ICD-10-CM | POA: Diagnosis not present

## 2017-04-10 DIAGNOSIS — R42 Dizziness and giddiness: Secondary | ICD-10-CM | POA: Diagnosis not present

## 2017-04-10 DIAGNOSIS — E119 Type 2 diabetes mellitus without complications: Secondary | ICD-10-CM | POA: Diagnosis not present

## 2017-04-10 DIAGNOSIS — Z7984 Long term (current) use of oral hypoglycemic drugs: Secondary | ICD-10-CM | POA: Diagnosis not present

## 2017-04-10 DIAGNOSIS — Z7982 Long term (current) use of aspirin: Secondary | ICD-10-CM | POA: Diagnosis not present

## 2017-04-10 DIAGNOSIS — I10 Essential (primary) hypertension: Secondary | ICD-10-CM | POA: Diagnosis not present

## 2017-04-10 DIAGNOSIS — Z79899 Other long term (current) drug therapy: Secondary | ICD-10-CM | POA: Diagnosis not present

## 2017-04-17 ENCOUNTER — Encounter (HOSPITAL_COMMUNITY): Payer: Self-pay | Admitting: Emergency Medicine

## 2017-04-17 ENCOUNTER — Emergency Department (HOSPITAL_COMMUNITY)
Admission: EM | Admit: 2017-04-17 | Discharge: 2017-04-17 | Disposition: A | Discharge status: Home / Self Care | Payer: Medicare HMO | Attending: Emergency Medicine | Admitting: Emergency Medicine

## 2017-04-17 DIAGNOSIS — Z79899 Other long term (current) drug therapy: Secondary | ICD-10-CM | POA: Diagnosis not present

## 2017-04-17 DIAGNOSIS — E119 Type 2 diabetes mellitus without complications: Secondary | ICD-10-CM | POA: Diagnosis not present

## 2017-04-17 DIAGNOSIS — I10 Essential (primary) hypertension: Secondary | ICD-10-CM | POA: Insufficient documentation

## 2017-04-17 DIAGNOSIS — Z7982 Long term (current) use of aspirin: Secondary | ICD-10-CM | POA: Diagnosis not present

## 2017-04-17 DIAGNOSIS — Z7984 Long term (current) use of oral hypoglycemic drugs: Secondary | ICD-10-CM | POA: Insufficient documentation

## 2017-04-17 DIAGNOSIS — R1013 Epigastric pain: Secondary | ICD-10-CM | POA: Diagnosis present

## 2017-04-17 DIAGNOSIS — K2970 Gastritis, unspecified, without bleeding: Secondary | ICD-10-CM

## 2017-04-17 DIAGNOSIS — K2900 Acute gastritis without bleeding: Secondary | ICD-10-CM | POA: Diagnosis not present

## 2017-04-17 LAB — CBC
HEMATOCRIT: 40.3 % (ref 39.0–52.0)
HEMOGLOBIN: 13.5 g/dL (ref 13.0–17.0)
MCH: 30.1 pg (ref 26.0–34.0)
MCHC: 33.5 g/dL (ref 30.0–36.0)
MCV: 90 fL (ref 78.0–100.0)
Platelets: 276 10*3/uL (ref 150–400)
RBC: 4.48 MIL/uL (ref 4.22–5.81)
RDW: 13.3 % (ref 11.5–15.5)
WBC: 7.7 10*3/uL (ref 4.0–10.5)

## 2017-04-17 LAB — LIPASE, BLOOD: Lipase: 27 U/L (ref 11–51)

## 2017-04-17 LAB — URINALYSIS, ROUTINE W REFLEX MICROSCOPIC
BILIRUBIN URINE: NEGATIVE
Glucose, UA: NEGATIVE mg/dL
Hgb urine dipstick: NEGATIVE
Ketones, ur: NEGATIVE mg/dL
LEUKOCYTES UA: NEGATIVE
NITRITE: NEGATIVE
PH: 5 (ref 5.0–8.0)
Protein, ur: NEGATIVE mg/dL
SPECIFIC GRAVITY, URINE: 1.005 (ref 1.005–1.030)

## 2017-04-17 LAB — COMPREHENSIVE METABOLIC PANEL
ALK PHOS: 74 U/L (ref 38–126)
ALT: 24 U/L (ref 17–63)
AST: 21 U/L (ref 15–41)
Albumin: 4.1 g/dL (ref 3.5–5.0)
Anion gap: 11 (ref 5–15)
BUN: 10 mg/dL (ref 6–20)
CHLORIDE: 100 mmol/L — AB (ref 101–111)
CO2: 27 mmol/L (ref 22–32)
Calcium: 9.7 mg/dL (ref 8.9–10.3)
Creatinine, Ser: 1.25 mg/dL — ABNORMAL HIGH (ref 0.61–1.24)
GFR, EST NON AFRICAN AMERICAN: 55 mL/min — AB (ref >60)
GLUCOSE: 95 mg/dL (ref 65–99)
Potassium: 4.2 mmol/L (ref 3.5–5.1)
Sodium: 138 mmol/L (ref 135–145)
TOTAL PROTEIN: 7.3 g/dL (ref 6.5–8.1)
Total Bilirubin: 0.3 mg/dL (ref 0.3–1.2)

## 2017-04-17 LAB — TROPONIN I: Troponin I: <0.03 ng/mL (ref <0.03)

## 2017-04-17 MED ORDER — PANTOPRAZOLE SODIUM 40 MG IV SOLR
40.0000 mg | Freq: Once | INTRAVENOUS | Status: AC
  Administered 2017-04-17: 40 mg via INTRAVENOUS
  Filled 2017-04-17: qty 40

## 2017-04-17 MED ORDER — FAMOTIDINE 20 MG PO TABS: 20.0000 mg | ORAL_TABLET | Freq: Two times a day (BID) | ORAL | 0 refills | Status: DC

## 2017-04-17 MED ORDER — GI COCKTAIL ~~LOC~~
30.0000 mL | Freq: Once | ORAL | Status: AC
  Administered 2017-04-17: 30 mL via ORAL
  Filled 2017-04-17: qty 30

## 2017-04-17 MED ORDER — FAMOTIDINE IN NACL 20-0.9 MG/50ML-% IV SOLN
20.0000 mg | INTRAVENOUS | Status: AC
  Administered 2017-04-17: 20 mg via INTRAVENOUS
  Filled 2017-04-17: qty 50

## 2017-04-17 MED ORDER — SUCRALFATE 1 G PO TABS: 1.0000 g | ORAL_TABLET | Freq: Three times a day (TID) | ORAL | 0 refills | Status: DC

## 2017-04-17 NOTE — ED Provider Notes (Signed)
[facility]-EMERGENCY DEPT Provider Note   CSN: [id number] Arrival date & time: 04/17/17  1117     History   Chief Complaint Chief Complaint  Patient presents with  . Abdominal Pain    HPI Wayne Calhoun is a 76 y.o. male.  HPI  The patient is a 76 year old male, he has a known history of an umbilical hernia repair, recently diagnosed with diverticulitis at an outside hospital, also being treated for diabetes high cholesterol high blood pressure and acid reflux with omeprazole. The patient presents to the hospital today after having several days' worth of increasing amount of epigastric burning radiating up into his chest associated with nausea and occasional vomiting. This seems to get worse during the daytime, sometimes at nighttime, it resolves after about an hour spontaneously and is not associated with position or exertion. There is no associated diarrhea dysuria fevers chills coughing or shortness of breath. He denies any history of chest pain or exertional cardiac symptoms. He was recently treated with Augmentin for this diverticulitis, states that his lower abdominal pain is improved and is essentially gone away. No history of alcohol use, heavy NSAID use or pancreatitis  Past Medical History:  Diagnosis Date  . BPH (benign prostatic hyperplasia)   . Diabetes mellitus without complication (HCC)   . Hypercholesteremia   . Hypertension     There are no active problems to display for this patient.   History reviewed. No pertinent surgical history.     Home Medications    Prior to Admission medications   Medication Sig Start Date End Date Taking? Authorizing Provider  acetaminophen (TYLENOL) 500 MG tablet Take 500 mg by mouth every 6 (six) hours as needed for mild pain or moderate pain.   Yes [provider]  alum & mag hydroxide-simeth (MAALOX/MYLANTA) 200-200-20 MG/5ML suspension Take 15 mLs by mouth every 6 (six) hours as needed for indigestion or heartburn.    Yes [provider]  amoxicillin-clavulanate (AUGMENTIN) 875-125 MG tablet Take 1 tablet by mouth 2 (two) times daily. 04/10/17  Yes [provider]  aspirin EC 81 MG tablet Take 81 mg by mouth daily.   Yes [provider]  escitalopram (LEXAPRO) 10 MG tablet Take 10 mg by mouth daily. 03/09/17  Yes [provider]  Ginseng 250 MG CAPS Take 250 mg by mouth daily.   Yes [provider]  glipiZIDE (GLUCOTROL XL) 5 MG 24 hr tablet Take 5 mg by mouth daily with breakfast.    Yes [provider]  HYDROcodone-acetaminophen (NORCO/VICODIN) 5-325 MG tablet Take 1 tablet by mouth every 6 (six) hours as needed. Abdominal pain 04/10/17  Yes [provider]  losartan-hydrochlorothiazide (HYZAAR) 100-25 MG per tablet Take 1 tablet by mouth daily.   Yes [provider]  lovastatin (MEVACOR) 20 MG tablet Take 20 mg by mouth at bedtime.   Yes [provider]  metFORMIN (GLUCOPHAGE-XR) 500 MG 24 hr tablet Take 2,000 mg by mouth daily.   Yes [provider]  Multiple Vitamins-Minerals (MULTIVITAMIN GUMMIES MENS) CHEW Chew 2 Units by mouth daily.   Yes [provider]  omeprazole (PRILOSEC) 20 MG capsule Take 20 mg by mouth daily.   Yes [provider]  psyllium (METAMUCIL) 58.6 % packet Take 1 packet by mouth daily.   Yes [provider]  tamsulosin (FLOMAX) 0.4 MG CAPS capsule Take 0.4 mg by mouth at bedtime.   Yes [provider]  Tetrahydrozoline HCl (EYE DROPS OP) Apply 1 drop to  eye daily as needed (FOR DRY EYE/IRRITATION).   Yes [provider]  famotidine (PEPCID) 20 MG tablet Take 1 tablet (20 mg total) by mouth 2 (two) times daily. 04/17/17   Eber Hong, MD  sucralfate (CARAFATE) 1 g tablet Take 1 tablet (1 g total) by mouth 4 (four) times daily -  with meals and at bedtime. 04/17/17   Eber Hong, MD    Family History No family history on file.  Social History Social  History  Substance Use Topics  . Smoking status: Never Smoker  . Smokeless tobacco: Never Used  . Alcohol use No     Allergies   Patient has no allergy information on record.   Review of Systems Review of Systems  All other systems reviewed and are negative.    Physical Exam Updated Vital Signs BP (!) 89/71   Pulse (!) 59   Temp 97.7 F (36.5 C)   Resp 12   Ht  (1.651 m)   Wt 76.7 kg (169 lb)   SpO2 98%   BMI 28.12 kg/m   Physical Exam  Constitutional: He appears well-developed and well-nourished. No distress.  HENT:  Head: Normocephalic and atraumatic.  Mouth/Throat: Oropharynx is clear and moist. No oropharyngeal exudate.  Eyes: Pupils are equal, round, and reactive to light. Conjunctivae and EOM are normal. Right eye exhibits no discharge. Left eye exhibits no discharge. No scleral icterus.  Neck: Normal range of motion. Neck supple. No JVD present. No thyromegaly present.  Cardiovascular: Normal rate, regular rhythm, normal heart sounds and intact distal pulses.  Exam reveals no gallop and no friction rub.   No murmur heard. Pulmonary/Chest: Effort normal and breath sounds normal. No respiratory distress. He has no wheezes. He has no rales.  Abdominal: Soft. Bowel sounds are normal. He exhibits no distension (no distention) and no mass. There is tenderness ( mild epigastric and bilateral upper abdominal pain / tenderness, no lower abd ttp).  Musculoskeletal: Normal range of motion. He exhibits no edema or tenderness.  Lymphadenopathy:    He has no cervical adenopathy.  Neurological: He is alert. Coordination normal.  Skin: Skin is warm and dry. No rash noted. No erythema.  Psychiatric: He has a normal mood and affect. His behavior is normal.  Nursing note and vitals reviewed.    ED Treatments / Results  Labs (all labs ordered are listed, but only abnormal results are displayed) Labs Reviewed  COMPREHENSIVE METABOLIC PANEL - Abnormal; Notable for the  following:       Result Value   Chloride 100 (*)    Creatinine, Ser 1.25 (*)    GFR calc non Af Amer 55 (*)    All other components within normal limits  URINALYSIS, ROUTINE W REFLEX MICROSCOPIC - Abnormal; Notable for the following:    Color, Urine STRAW (*)    All other components within normal limits  LIPASE, BLOOD  CBC  TROPONIN I    EKG  EKG Interpretation  Date/Time:  Friday April 17 2017 12:18:34 EDT Ventricular Rate:  61 PR Interval:    QRS Duration: 93 QT Interval:  403 QTC Calculation: 406 R Axis:   52 Text Interpretation:  Sinus rhythm Borderline T abnormalities, inferior leads No old tracing to compare Confirmed by Eber Hong ([id number]) on 04/17/2017 1:47:26 PM       Radiology No results found.  Procedures Procedures (including critical care time)  Medications Ordered in ED Medications  famotidine (PEPCID) IVPB 20 mg premix (20 mg Intravenous  New Bag/Given 04/17/17 1236)  gi cocktail (Maalox,Lidocaine,Donnatal) (30 mLs Oral Given 04/17/17 1236)  pantoprazole (PROTONIX) injection 40 mg (40 mg Intravenous Given 04/17/17 1236)     Initial Impression / Assessment and Plan / ED Course  I have reviewed the triage vital signs and the nursing notes.  Pertinent labs & imaging results that were available during my care of the patient were reviewed by me and considered in my medical decision making (see chart for details).   Symptoms could be consistent with pancreatitis, acid reflux, gastritis or peptic ulcer disease. At this time he has a nonsurgical abdomen, CT scan will be reviewed if possible, labs ordered, medicines for acid reflux ordered, patient agreeable to the workup  Has reassuring labs - no LFT abnormaliteis, no leukocysotis, trop neg Pt informed Bette rwith  meds above. Stable as above   Final Clinical Impressions(s) / ED Diagnoses   Final diagnoses:  Gastritis without bleeding, unspecified chronicity, unspecified gastritis type    New  Prescriptions New Prescriptions   FAMOTIDINE (PEPCID) 20 MG TABLET    Take 1 tablet (20 mg total) by mouth 2 (two) times daily.   SUCRALFATE (CARAFATE) 1 G TABLET    Take 1 tablet (1 g total) by mouth 4 (four) times daily -  with meals and at bedtime.     Eber Hong, MD 04/17/17 [phone]

## 2017-04-17 NOTE — ED Triage Notes (Signed)
Pt c/o continued burning abd pain/nausea and indigestion. Pt dx with diverticulitis x 2 weeks ago.

## 2017-04-17 NOTE — Discharge Instructions (Signed)
Your testing was normal Start Taking Pepcid twice daily for 2 weeks Carafate 3 times daily with meals  ER for worsening symptoms.

## 2017-05-14 DIAGNOSIS — Z9189 Other specified personal risk factors, not elsewhere classified: Secondary | ICD-10-CM | POA: Diagnosis not present

## 2017-05-14 DIAGNOSIS — I10 Essential (primary) hypertension: Secondary | ICD-10-CM | POA: Diagnosis not present

## 2017-05-14 DIAGNOSIS — N182 Chronic kidney disease, stage 2 (mild): Secondary | ICD-10-CM | POA: Diagnosis not present

## 2017-05-14 DIAGNOSIS — R69 Illness, unspecified: Secondary | ICD-10-CM | POA: Diagnosis not present

## 2017-05-14 DIAGNOSIS — R531 Weakness: Secondary | ICD-10-CM | POA: Diagnosis not present

## 2017-05-14 DIAGNOSIS — E1122 Type 2 diabetes mellitus with diabetic chronic kidney disease: Secondary | ICD-10-CM | POA: Diagnosis not present

## 2017-05-14 DIAGNOSIS — E782 Mixed hyperlipidemia: Secondary | ICD-10-CM | POA: Diagnosis not present

## 2017-05-15 DIAGNOSIS — E1122 Type 2 diabetes mellitus with diabetic chronic kidney disease: Secondary | ICD-10-CM | POA: Diagnosis not present

## 2017-05-19 DIAGNOSIS — E1122 Type 2 diabetes mellitus with diabetic chronic kidney disease: Secondary | ICD-10-CM | POA: Diagnosis not present

## 2017-05-19 DIAGNOSIS — Z6827 Body mass index (BMI) 27.0-27.9, adult: Secondary | ICD-10-CM | POA: Diagnosis not present

## 2017-05-19 DIAGNOSIS — E782 Mixed hyperlipidemia: Secondary | ICD-10-CM | POA: Diagnosis not present

## 2017-05-19 DIAGNOSIS — I10 Essential (primary) hypertension: Secondary | ICD-10-CM | POA: Diagnosis not present

## 2017-05-19 DIAGNOSIS — N400 Enlarged prostate without lower urinary tract symptoms: Secondary | ICD-10-CM | POA: Diagnosis not present

## 2017-05-19 DIAGNOSIS — G4733 Obstructive sleep apnea (adult) (pediatric): Secondary | ICD-10-CM | POA: Diagnosis not present

## 2017-05-19 DIAGNOSIS — J301 Allergic rhinitis due to pollen: Secondary | ICD-10-CM | POA: Diagnosis not present

## 2017-05-23 DIAGNOSIS — R69 Illness, unspecified: Secondary | ICD-10-CM | POA: Diagnosis not present

## 2017-07-13 DIAGNOSIS — Z6828 Body mass index (BMI) 28.0-28.9, adult: Secondary | ICD-10-CM | POA: Diagnosis not present

## 2017-07-13 DIAGNOSIS — M7062 Trochanteric bursitis, left hip: Secondary | ICD-10-CM | POA: Diagnosis not present

## 2017-07-13 DIAGNOSIS — M7122 Synovial cyst of popliteal space [Baker], left knee: Secondary | ICD-10-CM | POA: Diagnosis not present

## 2017-07-24 DIAGNOSIS — E119 Type 2 diabetes mellitus without complications: Secondary | ICD-10-CM | POA: Diagnosis not present

## 2017-07-24 DIAGNOSIS — H2513 Age-related nuclear cataract, bilateral: Secondary | ICD-10-CM | POA: Diagnosis not present

## 2017-07-24 DIAGNOSIS — H40013 Open angle with borderline findings, low risk, bilateral: Secondary | ICD-10-CM | POA: Diagnosis not present

## 2017-08-04 DIAGNOSIS — Z6827 Body mass index (BMI) 27.0-27.9, adult: Secondary | ICD-10-CM | POA: Diagnosis not present

## 2017-08-04 DIAGNOSIS — M7712 Lateral epicondylitis, left elbow: Secondary | ICD-10-CM | POA: Diagnosis not present

## 2017-09-16 DIAGNOSIS — R42 Dizziness and giddiness: Secondary | ICD-10-CM | POA: Diagnosis not present

## 2017-09-16 DIAGNOSIS — N182 Chronic kidney disease, stage 2 (mild): Secondary | ICD-10-CM | POA: Diagnosis not present

## 2017-09-16 DIAGNOSIS — M545 Low back pain: Secondary | ICD-10-CM | POA: Diagnosis not present

## 2017-09-16 DIAGNOSIS — Z9189 Other specified personal risk factors, not elsewhere classified: Secondary | ICD-10-CM | POA: Diagnosis not present

## 2017-09-16 DIAGNOSIS — E1122 Type 2 diabetes mellitus with diabetic chronic kidney disease: Secondary | ICD-10-CM | POA: Diagnosis not present

## 2017-09-16 DIAGNOSIS — E782 Mixed hyperlipidemia: Secondary | ICD-10-CM | POA: Diagnosis not present

## 2017-09-16 DIAGNOSIS — I10 Essential (primary) hypertension: Secondary | ICD-10-CM | POA: Diagnosis not present

## 2017-09-18 DIAGNOSIS — I10 Essential (primary) hypertension: Secondary | ICD-10-CM | POA: Diagnosis not present

## 2017-09-18 DIAGNOSIS — E782 Mixed hyperlipidemia: Secondary | ICD-10-CM | POA: Diagnosis not present

## 2017-09-18 DIAGNOSIS — J301 Allergic rhinitis due to pollen: Secondary | ICD-10-CM | POA: Diagnosis not present

## 2017-09-18 DIAGNOSIS — G4733 Obstructive sleep apnea (adult) (pediatric): Secondary | ICD-10-CM | POA: Diagnosis not present

## 2017-09-18 DIAGNOSIS — N400 Enlarged prostate without lower urinary tract symptoms: Secondary | ICD-10-CM | POA: Diagnosis not present

## 2017-09-18 DIAGNOSIS — Z6828 Body mass index (BMI) 28.0-28.9, adult: Secondary | ICD-10-CM | POA: Diagnosis not present

## 2017-09-18 DIAGNOSIS — E1122 Type 2 diabetes mellitus with diabetic chronic kidney disease: Secondary | ICD-10-CM | POA: Diagnosis not present

## 2017-10-09 DIAGNOSIS — M5417 Radiculopathy, lumbosacral region: Secondary | ICD-10-CM | POA: Diagnosis not present

## 2017-10-09 DIAGNOSIS — Z6827 Body mass index (BMI) 27.0-27.9, adult: Secondary | ICD-10-CM | POA: Diagnosis not present

## 2017-10-09 DIAGNOSIS — J209 Acute bronchitis, unspecified: Secondary | ICD-10-CM | POA: Diagnosis not present

## 2017-10-09 DIAGNOSIS — M545 Low back pain: Secondary | ICD-10-CM | POA: Diagnosis not present

## 2017-10-09 DIAGNOSIS — R05 Cough: Secondary | ICD-10-CM | POA: Diagnosis not present

## 2017-12-21 DIAGNOSIS — E119 Type 2 diabetes mellitus without complications: Secondary | ICD-10-CM | POA: Diagnosis not present

## 2017-12-21 DIAGNOSIS — H40013 Open angle with borderline findings, low risk, bilateral: Secondary | ICD-10-CM | POA: Diagnosis not present

## 2017-12-21 DIAGNOSIS — H25813 Combined forms of age-related cataract, bilateral: Secondary | ICD-10-CM | POA: Diagnosis not present

## 2017-12-30 DIAGNOSIS — B353 Tinea pedis: Secondary | ICD-10-CM | POA: Diagnosis not present

## 2017-12-30 DIAGNOSIS — L509 Urticaria, unspecified: Secondary | ICD-10-CM | POA: Diagnosis not present

## 2017-12-30 DIAGNOSIS — L299 Pruritus, unspecified: Secondary | ICD-10-CM | POA: Diagnosis not present

## 2018-01-03 ENCOUNTER — Encounter (HOSPITAL_COMMUNITY): Payer: Self-pay | Admitting: Emergency Medicine

## 2018-01-03 ENCOUNTER — Other Ambulatory Visit: Payer: Self-pay

## 2018-01-03 ENCOUNTER — Emergency Department (HOSPITAL_COMMUNITY)
Admission: EM | Admit: 2018-01-03 | Discharge: 2018-01-04 | Disposition: A | Discharge status: Home / Self Care | Payer: Medicare HMO | Attending: Emergency Medicine | Admitting: Emergency Medicine

## 2018-01-03 ENCOUNTER — Emergency Department (HOSPITAL_COMMUNITY): Payer: Medicare HMO

## 2018-01-03 DIAGNOSIS — Z7984 Long term (current) use of oral hypoglycemic drugs: Secondary | ICD-10-CM | POA: Insufficient documentation

## 2018-01-03 DIAGNOSIS — E119 Type 2 diabetes mellitus without complications: Secondary | ICD-10-CM | POA: Insufficient documentation

## 2018-01-03 DIAGNOSIS — Z79899 Other long term (current) drug therapy: Secondary | ICD-10-CM | POA: Diagnosis not present

## 2018-01-03 DIAGNOSIS — I10 Essential (primary) hypertension: Secondary | ICD-10-CM | POA: Diagnosis not present

## 2018-01-03 DIAGNOSIS — R51 Headache: Secondary | ICD-10-CM | POA: Insufficient documentation

## 2018-01-03 DIAGNOSIS — Z7982 Long term (current) use of aspirin: Secondary | ICD-10-CM | POA: Diagnosis not present

## 2018-01-03 DIAGNOSIS — R519 Headache, unspecified: Secondary | ICD-10-CM

## 2018-01-03 LAB — CBC WITH DIFFERENTIAL/PLATELET
Basophils Absolute: 0 10*3/uL (ref 0.0–0.1)
Basophils Relative: 0 %
Eosinophils Absolute: 0.3 10*3/uL (ref 0.0–0.7)
Eosinophils Relative: 3 %
HCT: 42.2 % (ref 39.0–52.0)
Hemoglobin: 13.9 g/dL (ref 13.0–17.0)
Lymphocytes Relative: 32 %
Lymphs Abs: 2.7 10*3/uL (ref 0.7–4.0)
MCH: 29.8 pg (ref 26.0–34.0)
MCHC: 32.9 g/dL (ref 30.0–36.0)
MCV: 90.6 fL (ref 78.0–100.0)
Monocytes Absolute: 0.5 10*3/uL (ref 0.1–1.0)
Monocytes Relative: 6 %
Neutro Abs: 4.9 10*3/uL (ref 1.7–7.7)
Neutrophils Relative %: 59 %
Platelets: 316 10*3/uL (ref 150–400)
RBC: 4.66 MIL/uL (ref 4.22–5.81)
RDW: 13.1 % (ref 11.5–15.5)
WBC: 8.3 10*3/uL (ref 4.0–10.5)

## 2018-01-03 LAB — BASIC METABOLIC PANEL
Anion gap: 7 (ref 5–15)
BUN: 17 mg/dL (ref 8–23)
CO2: 29 mmol/L (ref 22–32)
Calcium: 9.3 mg/dL (ref 8.9–10.3)
Chloride: 101 mmol/L (ref 98–111)
Creatinine, Ser: 1.12 mg/dL (ref 0.61–1.24)
GFR calc Af Amer: >60 mL/min (ref >60)
GFR calc non Af Amer: >60 mL/min (ref >60)
Glucose, Bld: 97 mg/dL (ref 70–99)
Potassium: 4 mmol/L (ref 3.5–5.1)
Sodium: 137 mmol/L (ref 135–145)

## 2018-01-03 MED ORDER — DIPHENHYDRAMINE HCL 12.5 MG/5ML PO ELIX
12.5000 mg | ORAL_SOLUTION | Freq: Once | ORAL | Status: AC
  Administered 2018-01-03: 12.5 mg via ORAL
  Filled 2018-01-03: qty 5

## 2018-01-03 MED ORDER — KETOROLAC TROMETHAMINE 30 MG/ML IJ SOLN
15.0000 mg | Freq: Once | INTRAMUSCULAR | Status: AC
  Administered 2018-01-03: 15 mg via INTRAVENOUS
  Filled 2018-01-03: qty 1

## 2018-01-03 MED ORDER — SODIUM CHLORIDE 0.9 % IV BOLUS
500.0000 mL | Freq: Once | INTRAVENOUS | Status: AC
  Administered 2018-01-03: 500 mL via INTRAVENOUS

## 2018-01-03 MED ORDER — METOCLOPRAMIDE HCL 5 MG/ML IJ SOLN
10.0000 mg | Freq: Once | INTRAMUSCULAR | Status: AC
  Administered 2018-01-03: 10 mg via INTRAVENOUS
  Filled 2018-01-03: qty 2

## 2018-01-03 NOTE — ED Notes (Signed)
Pt still unable to urinate at this time, reports headache relieved 0 of 10, water given

## 2018-01-03 NOTE — ED Triage Notes (Signed)
Patient g/o headache. Patient states stung by two wasp last Sunday in which he had swelling x2 days. Patient reports using ice, swelling decreased but still had pain. Patient states started to feel fatigued with nausea and headache. Denies any photosensitivity or vomiting. Patient states he has intermittent dizziness with standing that started prior to bee stings.

## 2018-01-04 NOTE — ED Provider Notes (Signed)
[facility] EMERGENCY DEPARTMENT Provider Note   CSN: [id number] Arrival date & time: 01/03/18  1602     History   Chief Complaint Chief Complaint  Patient presents with  . Headache    HPI Bonnita NasutiBobby L Douds is a 77 y.o. male.  HPI   77 year old male presenting with his wife for evaluation of headaches.  Patient states that he was stung by wasps 1 week ago.  He was stung on the top of his scalp.  He has had persistent headaches since then.  He also has felt generally weak.  He feels like he does has no energy.  He denies any similar headache history.  No acute neurologic complaints otherwise.  No fevers.  No urinary complaints.  No confusion per wife.  Past Medical History:  Diagnosis Date  . BPH (benign prostatic hyperplasia)   . Diabetes mellitus without complication (HCC)   . Hypercholesteremia   . Hypertension     There are no active problems to display for this patient.   History reviewed. No pertinent surgical history.      Home Medications    Prior to Admission medications   Medication Sig Start Date End Date Taking? Authorizing Provider  acetaminophen (TYLENOL) 500 MG tablet Take 500 mg by mouth every 6 (six) hours as needed for mild pain or moderate pain.   Yes [provider]  alum & mag hydroxide-simeth (MAALOX/MYLANTA) 200-200-20 MG/5ML suspension Take 15 mLs by mouth every 6 (six) hours as needed for indigestion or heartburn.   Yes [provider]  aspirin EC 81 MG tablet Take 81 mg by mouth daily.   Yes [provider]  cetirizine (ZYRTEC) 10 MG tablet Take 1 tablet by mouth daily. 12/31/17  Yes [provider]  escitalopram (LEXAPRO) 10 MG tablet Take 10 mg by mouth daily.   Yes [provider]  famotidine (PEPCID) 20 MG tablet Take 1 tablet (20 mg total) by mouth 2 (two) times daily. 04/17/17  Yes Eber HongMiller, Brian, MD  Ginseng 250 MG CAPS Take 250 mg by mouth daily.   Yes [provider]  glipiZIDE  (GLUCOTROL XL) 5 MG 24 hr tablet Take 5 mg by mouth daily with breakfast.    Yes [provider]  losartan-hydrochlorothiazide (HYZAAR) 100-25 MG per tablet Take 1 tablet by mouth daily.   Yes [provider]  lovastatin (MEVACOR) 20 MG tablet Take 20 mg by mouth at bedtime.   Yes [provider]  metFORMIN (GLUCOPHAGE-XR) 500 MG 24 hr tablet Take 2,000 mg by mouth daily.   Yes [provider]  Multiple Vitamins-Minerals (MULTIVITAMIN GUMMIES MENS) CHEW Chew 2 Units by mouth daily.   Yes [provider]  omeprazole (PRILOSEC) 20 MG capsule Take 20 mg by mouth daily.   Yes [provider]  psyllium (METAMUCIL) 58.6 % packet Take 1 packet by mouth daily.   Yes [provider]  tamsulosin (FLOMAX) 0.4 MG CAPS capsule Take 0.4 mg by mouth at bedtime.   Yes [provider]  Tetrahydrozoline HCl (EYE DROPS OP) Apply 1 drop to eye daily as needed (FOR DRY EYE/IRRITATION).   Yes [provider]    Family History No family history on file.  Social History Social History   Tobacco Use  . Smoking status: Never Smoker  . Smokeless tobacco: Never Used  Substance Use Topics  . Alcohol use: No  . Drug use: No     Allergies   Codeine   Review of  Systems Review of Systems  All systems reviewed and negative, other than as noted in HPI.  Physical Exam Updated Vital Signs BP 135/73   Pulse 67   Temp 98.8 F (37.1 C) (Temporal)   Resp (!) 21   Ht 5\' 5"  (1.651 m)   Wt 76.2 kg (168 lb)   SpO2 98%   BMI 27.96 kg/m    Physical Exam  Constitutional: He is oriented to person, place, and time. He appears well-developed and well-nourished. No distress.  HENT:  Head: Normocephalic and atraumatic.  Scalp grossly normal in appearance.  No nuchal rigidity.  Eyes: Conjunctivae are normal. Right eye exhibits no discharge. Left eye exhibits no discharge.  Neck: Neck supple.  Cardiovascular: Normal rate, regular rhythm  and normal heart sounds. Exam reveals no gallop and no friction rub.  No murmur heard. Pulmonary/Chest: Effort normal and breath sounds normal. No respiratory distress.  Abdominal: Soft. He exhibits no distension. There is no tenderness.  Musculoskeletal: He exhibits no edema or tenderness.  Neurological: He is alert and oriented to person, place, and time. No cranial nerve deficit. He exhibits normal muscle tone. Coordination normal.  Skin: Skin is warm and dry.  Psychiatric: He has a normal mood and affect. His behavior is normal. Thought content normal.  Nursing note and vitals reviewed.    ED Treatments / Results  Labs (all labs ordered are listed, but only abnormal results are displayed) Labs Reviewed  CBC WITH DIFFERENTIAL/PLATELET  BASIC METABOLIC PANEL    EKG EKG Interpretation  Date/Time:  Sunday January 03 2018 20:55:39 EDT Ventricular Rate:  69 PR Interval:    QRS Duration: 90 QT Interval:  417 QTC Calculation: 447 R Axis:   53 Text Interpretation:  2Sinus rhythm Confirmed by Richardo Popoff ([id number]) on 01/03/2018 10:02:25 PM   Radiology Ct Head Wo Contrast  Result Date: 01/03/2018 CLINICAL DATA:  Headaches, history of recent bee stings EXAM: CT HEAD WITHOUT CONTRAST TECHNIQUE: Contiguous axial images were obtained from the base of the skull through the vertex without intravenous contrast. COMPARISON:  None. FINDINGS: Brain: No evidence of acute infarction, hemorrhage, hydrocephalus, extra-axial collection or mass lesion/mass effect. Vascular: No hyperdense vessel or unexpected calcification. Skull: Normal. Negative for fracture or focal lesion. Sinuses/Orbits: No acute finding. Other: None. IMPRESSION: No acute intracranial abnormality noted. Electronically Signed   By: Mark  Lukens M.D.   On: 01/03/2018 21:07    Procedures Procedures (including critical care time)  Medications Ordered in ED Medications  sodium chloride 0.9 % bolus 500 mL (0 mLs Intravenous Stopped  01/03/18 2257)  ketorolac (TORADOL) 30 MG/ML injection 15 mg (15 mg Intravenous Given 01/03/18 2101)  metoCLOPramide (REGLAN) injection 10 mg (10 mg Intravenous Given 01/03/18 2101)  diphenhydrAMINE (BENADRYL) 12.5 MG/5ML elixir 12.5 mg (12.5 mg Oral Given 01/03/18 2102)     Initial Impression / Assessment and Plan / ED Course  I have reviewed the triage vital signs and the nursing notes.  Pertinent labs & imaging results that were available during my care of the patient were reviewed by me and considered in my medical decision making (see chart for details).     77  year old male with headaches.  Unclear etiology, but doubt emergent process.  She has no other evidence of bleed.  No mass.No acute visual complaints.  Headache now completely resolved after meds.  PCP follow-up for recurrent symptoms.  Final Clinical Impressions(s) / ED Diagnoses   Final diagnoses:  Nonintractable headache, unspecified chronicity pattern, unspecified headache type  ED Discharge Orders    None      Raeford Razor, MD 01/04/18 2357

## 2018-01-18 DIAGNOSIS — E1122 Type 2 diabetes mellitus with diabetic chronic kidney disease: Secondary | ICD-10-CM | POA: Diagnosis not present

## 2018-01-18 DIAGNOSIS — E782 Mixed hyperlipidemia: Secondary | ICD-10-CM | POA: Diagnosis not present

## 2018-01-18 DIAGNOSIS — M545 Low back pain: Secondary | ICD-10-CM | POA: Diagnosis not present

## 2018-01-18 DIAGNOSIS — Z9189 Other specified personal risk factors, not elsewhere classified: Secondary | ICD-10-CM | POA: Diagnosis not present

## 2018-01-18 DIAGNOSIS — I10 Essential (primary) hypertension: Secondary | ICD-10-CM | POA: Diagnosis not present

## 2018-01-18 DIAGNOSIS — N182 Chronic kidney disease, stage 2 (mild): Secondary | ICD-10-CM | POA: Diagnosis not present

## 2018-01-18 DIAGNOSIS — R42 Dizziness and giddiness: Secondary | ICD-10-CM | POA: Diagnosis not present

## 2018-01-20 DIAGNOSIS — Z0001 Encounter for general adult medical examination with abnormal findings: Secondary | ICD-10-CM | POA: Diagnosis not present

## 2018-01-20 DIAGNOSIS — Z1212 Encounter for screening for malignant neoplasm of rectum: Secondary | ICD-10-CM | POA: Diagnosis not present

## 2018-01-20 DIAGNOSIS — Z23 Encounter for immunization: Secondary | ICD-10-CM | POA: Diagnosis not present

## 2018-01-20 DIAGNOSIS — Z6828 Body mass index (BMI) 28.0-28.9, adult: Secondary | ICD-10-CM | POA: Diagnosis not present

## 2018-01-20 DIAGNOSIS — I10 Essential (primary) hypertension: Secondary | ICD-10-CM | POA: Diagnosis not present

## 2018-01-20 DIAGNOSIS — E1122 Type 2 diabetes mellitus with diabetic chronic kidney disease: Secondary | ICD-10-CM | POA: Diagnosis not present

## 2018-01-20 DIAGNOSIS — E1165 Type 2 diabetes mellitus with hyperglycemia: Secondary | ICD-10-CM | POA: Diagnosis not present

## 2018-04-05 DIAGNOSIS — K219 Gastro-esophageal reflux disease without esophagitis: Secondary | ICD-10-CM | POA: Diagnosis not present

## 2018-04-05 DIAGNOSIS — M25562 Pain in left knee: Secondary | ICD-10-CM | POA: Diagnosis not present

## 2018-04-05 DIAGNOSIS — R109 Unspecified abdominal pain: Secondary | ICD-10-CM | POA: Diagnosis not present

## 2018-04-13 DIAGNOSIS — K219 Gastro-esophageal reflux disease without esophagitis: Secondary | ICD-10-CM | POA: Diagnosis not present

## 2018-04-13 DIAGNOSIS — Z6827 Body mass index (BMI) 27.0-27.9, adult: Secondary | ICD-10-CM | POA: Diagnosis not present

## 2018-04-13 DIAGNOSIS — D649 Anemia, unspecified: Secondary | ICD-10-CM | POA: Diagnosis not present

## 2018-04-13 DIAGNOSIS — R109 Unspecified abdominal pain: Secondary | ICD-10-CM | POA: Diagnosis not present

## 2018-04-13 DIAGNOSIS — Z23 Encounter for immunization: Secondary | ICD-10-CM | POA: Diagnosis not present

## 2018-04-13 DIAGNOSIS — M25562 Pain in left knee: Secondary | ICD-10-CM | POA: Diagnosis not present

## 2018-04-15 DIAGNOSIS — R1084 Generalized abdominal pain: Secondary | ICD-10-CM | POA: Diagnosis not present

## 2018-04-15 DIAGNOSIS — K824 Cholesterolosis of gallbladder: Secondary | ICD-10-CM | POA: Diagnosis not present

## 2018-04-16 DIAGNOSIS — R109 Unspecified abdominal pain: Secondary | ICD-10-CM | POA: Diagnosis not present

## 2018-05-05 DIAGNOSIS — K219 Gastro-esophageal reflux disease without esophagitis: Secondary | ICD-10-CM | POA: Diagnosis not present

## 2018-05-05 DIAGNOSIS — M25562 Pain in left knee: Secondary | ICD-10-CM | POA: Diagnosis not present

## 2018-05-28 DIAGNOSIS — E1142 Type 2 diabetes mellitus with diabetic polyneuropathy: Secondary | ICD-10-CM | POA: Diagnosis not present

## 2018-05-28 DIAGNOSIS — E1122 Type 2 diabetes mellitus with diabetic chronic kidney disease: Secondary | ICD-10-CM | POA: Diagnosis not present

## 2018-05-28 DIAGNOSIS — I10 Essential (primary) hypertension: Secondary | ICD-10-CM | POA: Diagnosis not present

## 2018-05-28 DIAGNOSIS — Z9189 Other specified personal risk factors, not elsewhere classified: Secondary | ICD-10-CM | POA: Diagnosis not present

## 2018-05-28 DIAGNOSIS — E782 Mixed hyperlipidemia: Secondary | ICD-10-CM | POA: Diagnosis not present

## 2018-05-28 DIAGNOSIS — E1165 Type 2 diabetes mellitus with hyperglycemia: Secondary | ICD-10-CM | POA: Diagnosis not present

## 2018-05-31 DIAGNOSIS — G4733 Obstructive sleep apnea (adult) (pediatric): Secondary | ICD-10-CM | POA: Diagnosis not present

## 2018-05-31 DIAGNOSIS — I10 Essential (primary) hypertension: Secondary | ICD-10-CM | POA: Diagnosis not present

## 2018-05-31 DIAGNOSIS — E1122 Type 2 diabetes mellitus with diabetic chronic kidney disease: Secondary | ICD-10-CM | POA: Diagnosis not present

## 2018-05-31 DIAGNOSIS — N400 Enlarged prostate without lower urinary tract symptoms: Secondary | ICD-10-CM | POA: Diagnosis not present

## 2018-05-31 DIAGNOSIS — M545 Low back pain: Secondary | ICD-10-CM | POA: Diagnosis not present

## 2018-05-31 DIAGNOSIS — E1165 Type 2 diabetes mellitus with hyperglycemia: Secondary | ICD-10-CM | POA: Diagnosis not present

## 2018-05-31 DIAGNOSIS — Z6827 Body mass index (BMI) 27.0-27.9, adult: Secondary | ICD-10-CM | POA: Diagnosis not present

## 2018-05-31 DIAGNOSIS — E782 Mixed hyperlipidemia: Secondary | ICD-10-CM | POA: Diagnosis not present

## 2018-06-01 DIAGNOSIS — E1165 Type 2 diabetes mellitus with hyperglycemia: Secondary | ICD-10-CM | POA: Diagnosis not present

## 2018-06-28 DIAGNOSIS — H25813 Combined forms of age-related cataract, bilateral: Secondary | ICD-10-CM | POA: Diagnosis not present

## 2018-06-28 DIAGNOSIS — E119 Type 2 diabetes mellitus without complications: Secondary | ICD-10-CM | POA: Diagnosis not present

## 2018-06-28 DIAGNOSIS — H40013 Open angle with borderline findings, low risk, bilateral: Secondary | ICD-10-CM | POA: Diagnosis not present

## 2018-07-05 DIAGNOSIS — I10 Essential (primary) hypertension: Secondary | ICD-10-CM | POA: Diagnosis not present

## 2018-07-05 DIAGNOSIS — K219 Gastro-esophageal reflux disease without esophagitis: Secondary | ICD-10-CM | POA: Diagnosis not present

## 2018-07-05 DIAGNOSIS — R69 Illness, unspecified: Secondary | ICD-10-CM | POA: Diagnosis not present

## 2018-07-05 DIAGNOSIS — E782 Mixed hyperlipidemia: Secondary | ICD-10-CM | POA: Diagnosis not present

## 2018-07-12 DIAGNOSIS — B9789 Other viral agents as the cause of diseases classified elsewhere: Secondary | ICD-10-CM | POA: Diagnosis not present

## 2018-07-12 DIAGNOSIS — I10 Essential (primary) hypertension: Secondary | ICD-10-CM | POA: Diagnosis not present

## 2018-07-12 DIAGNOSIS — Z7982 Long term (current) use of aspirin: Secondary | ICD-10-CM | POA: Diagnosis not present

## 2018-07-12 DIAGNOSIS — E7800 Pure hypercholesterolemia, unspecified: Secondary | ICD-10-CM | POA: Diagnosis not present

## 2018-07-12 DIAGNOSIS — Z7984 Long term (current) use of oral hypoglycemic drugs: Secondary | ICD-10-CM | POA: Diagnosis not present

## 2018-07-12 DIAGNOSIS — Z79899 Other long term (current) drug therapy: Secondary | ICD-10-CM | POA: Diagnosis not present

## 2018-07-12 DIAGNOSIS — R05 Cough: Secondary | ICD-10-CM | POA: Diagnosis not present

## 2018-07-12 DIAGNOSIS — J069 Acute upper respiratory infection, unspecified: Secondary | ICD-10-CM | POA: Diagnosis not present

## 2018-07-12 DIAGNOSIS — E119 Type 2 diabetes mellitus without complications: Secondary | ICD-10-CM | POA: Diagnosis not present

## 2018-07-12 DIAGNOSIS — R109 Unspecified abdominal pain: Secondary | ICD-10-CM | POA: Diagnosis not present

## 2018-07-15 DIAGNOSIS — J209 Acute bronchitis, unspecified: Secondary | ICD-10-CM | POA: Diagnosis not present

## 2018-07-15 DIAGNOSIS — R05 Cough: Secondary | ICD-10-CM | POA: Diagnosis not present

## 2018-07-15 DIAGNOSIS — J069 Acute upper respiratory infection, unspecified: Secondary | ICD-10-CM | POA: Diagnosis not present

## 2018-07-15 DIAGNOSIS — Z6828 Body mass index (BMI) 28.0-28.9, adult: Secondary | ICD-10-CM | POA: Diagnosis not present

## 2018-07-26 DIAGNOSIS — R05 Cough: Secondary | ICD-10-CM | POA: Diagnosis not present

## 2018-07-26 DIAGNOSIS — Z6827 Body mass index (BMI) 27.0-27.9, adult: Secondary | ICD-10-CM | POA: Diagnosis not present

## 2018-07-31 ENCOUNTER — Emergency Department (HOSPITAL_COMMUNITY): Payer: Medicare HMO

## 2018-07-31 ENCOUNTER — Emergency Department (HOSPITAL_COMMUNITY)
Admission: EM | Admit: 2018-07-31 | Discharge: 2018-07-31 | Disposition: A | Discharge status: Home / Self Care | Payer: Medicare HMO | Attending: Emergency Medicine | Admitting: Emergency Medicine

## 2018-07-31 ENCOUNTER — Other Ambulatory Visit: Payer: Self-pay

## 2018-07-31 ENCOUNTER — Encounter (HOSPITAL_COMMUNITY): Payer: Self-pay | Admitting: Emergency Medicine

## 2018-07-31 DIAGNOSIS — K760 Fatty (change of) liver, not elsewhere classified: Secondary | ICD-10-CM | POA: Diagnosis not present

## 2018-07-31 DIAGNOSIS — J209 Acute bronchitis, unspecified: Secondary | ICD-10-CM | POA: Diagnosis not present

## 2018-07-31 DIAGNOSIS — Z7982 Long term (current) use of aspirin: Secondary | ICD-10-CM | POA: Insufficient documentation

## 2018-07-31 DIAGNOSIS — Z7984 Long term (current) use of oral hypoglycemic drugs: Secondary | ICD-10-CM | POA: Diagnosis not present

## 2018-07-31 DIAGNOSIS — I10 Essential (primary) hypertension: Secondary | ICD-10-CM | POA: Diagnosis not present

## 2018-07-31 DIAGNOSIS — R1084 Generalized abdominal pain: Secondary | ICD-10-CM | POA: Diagnosis not present

## 2018-07-31 DIAGNOSIS — E119 Type 2 diabetes mellitus without complications: Secondary | ICD-10-CM | POA: Diagnosis not present

## 2018-07-31 DIAGNOSIS — K449 Diaphragmatic hernia without obstruction or gangrene: Secondary | ICD-10-CM | POA: Diagnosis not present

## 2018-07-31 DIAGNOSIS — R05 Cough: Secondary | ICD-10-CM | POA: Diagnosis present

## 2018-07-31 DIAGNOSIS — N200 Calculus of kidney: Secondary | ICD-10-CM | POA: Diagnosis not present

## 2018-07-31 DIAGNOSIS — Z79899 Other long term (current) drug therapy: Secondary | ICD-10-CM | POA: Insufficient documentation

## 2018-07-31 LAB — COMPREHENSIVE METABOLIC PANEL
ALBUMIN: 4.1 g/dL (ref 3.5–5.0)
ALT: 15 U/L (ref 0–44)
AST: 16 U/L (ref 15–41)
Alkaline Phosphatase: 71 U/L (ref 38–126)
Anion gap: 9 (ref 5–15)
BILIRUBIN TOTAL: 0.5 mg/dL (ref 0.3–1.2)
BUN: 12 mg/dL (ref 8–23)
CO2: 28 mmol/L (ref 22–32)
Calcium: 9.6 mg/dL (ref 8.9–10.3)
Chloride: 99 mmol/L (ref 98–111)
Creatinine, Ser: 1.05 mg/dL (ref 0.61–1.24)
GFR calc Af Amer: >60 mL/min (ref >60)
GFR calc non Af Amer: >60 mL/min (ref >60)
GLUCOSE: 172 mg/dL — AB (ref 70–99)
POTASSIUM: 4.2 mmol/L (ref 3.5–5.1)
Sodium: 136 mmol/L (ref 135–145)
TOTAL PROTEIN: 7.8 g/dL (ref 6.5–8.1)

## 2018-07-31 LAB — CBC
HEMATOCRIT: 43 % (ref 39.0–52.0)
HEMOGLOBIN: 13.9 g/dL (ref 13.0–17.0)
MCH: 28.4 pg (ref 26.0–34.0)
MCHC: 32.3 g/dL (ref 30.0–36.0)
MCV: 87.9 fL (ref 80.0–100.0)
Platelets: 298 10*3/uL (ref 150–400)
RBC: 4.89 MIL/uL (ref 4.22–5.81)
RDW: 13.3 % (ref 11.5–15.5)
WBC: 7.5 10*3/uL (ref 4.0–10.5)
nRBC: 0 % (ref 0.0–0.2)

## 2018-07-31 LAB — URINALYSIS, ROUTINE W REFLEX MICROSCOPIC
BILIRUBIN URINE: NEGATIVE
Glucose, UA: NEGATIVE mg/dL
HGB URINE DIPSTICK: NEGATIVE
Ketones, ur: NEGATIVE mg/dL
Leukocytes, UA: NEGATIVE
Nitrite: NEGATIVE
PH: 6 (ref 5.0–8.0)
Protein, ur: NEGATIVE mg/dL
SPECIFIC GRAVITY, URINE: 1.005 (ref 1.005–1.030)

## 2018-07-31 LAB — LIPASE, BLOOD: Lipase: 25 U/L (ref 11–51)

## 2018-07-31 MED ORDER — ALBUTEROL SULFATE (2.5 MG/3ML) 0.083% IN NEBU
2.5000 mg | INHALATION_SOLUTION | Freq: Once | RESPIRATORY_TRACT | Status: AC
  Administered 2018-07-31: 2.5 mg via RESPIRATORY_TRACT
  Filled 2018-07-31: qty 3

## 2018-07-31 MED ORDER — ALBUTEROL SULFATE HFA 108 (90 BASE) MCG/ACT IN AERS
2.0000 | INHALATION_SPRAY | Freq: Once | RESPIRATORY_TRACT | Status: AC
Start: 2018-07-31 — End: 2018-07-31
  Administered 2018-07-31: 2 via RESPIRATORY_TRACT
  Filled 2018-07-31: qty 6.7

## 2018-07-31 MED ORDER — IOPAMIDOL (ISOVUE-300) INJECTION 61%
100.0000 mL | Freq: Once | INTRAVENOUS | Status: AC | PRN
  Administered 2018-07-31: 100 mL via INTRAVENOUS

## 2018-07-31 NOTE — Discharge Instructions (Addendum)
You are seen in the emergency department for continued cough that is been going on for a few months and abdominal pain that started yesterday.  You had blood work and a CAT scan of your chest and abdomen.  There were no serious findings on the CAT scan.  You should use the albuterol inhaler 2 puffs every 6 hours as needed for cough /shortness of breath.  You should increase your fluid intake and try some increased fiber to.  You may try a laxative.  It will be important for you to follow-up with your doctor for continued management of your cough along with evaluation of the bladder thickening seen on the CT scan.  Return if any concerns

## 2018-07-31 NOTE — ED Provider Notes (Signed)
[facility] EMERGENCY DEPARTMENT Provider Note   CSN: [id number] Arrival date & time: 07/31/18  1712     History   Chief Complaint Chief Complaint  Patient presents with  . Abdominal Pain    HPI Wayne Calhoun is a 78 y.o. male.  He is complaining of a cough that is been going on about 7 weeks.  He was seen at another emergency department and seen by his PCP.  He has been treated with cough medication and Mucinex.  They tried a shot of steroids but he continues to have a mostly nonproductive cough sometimes a little bit yellow.  It is causing his chest to be sore from so much coughing. He said starting last night he has had sharp diffuse abdominal pain.  He thinks it may be related to his coughing but he is not sure if it something else.  He said no urinary symptoms no diarrhea.  No fevers or chills.  He said he can cough so much that he vomits but he does not feel particularly nauseous.  The history is provided by the patient and the spouse.  Abdominal Pain  Pain location:  Generalized Pain quality: sharp and stabbing   Pain radiates to:  Does not radiate Pain severity:  Moderate Onset quality:  Gradual Duration:  1 day Timing:  Intermittent Progression:  Unchanged Chronicity:  New Context: recent illness and retching   Context: not eating, not suspicious food intake and not trauma   Relieved by:  None tried Worsened by:  Coughing Ineffective treatments:  None tried Associated symptoms: chest pain, cough, sore throat and vomiting   Associated symptoms: no anorexia, no constipation, no dysuria, no fever, no hematemesis, no hematochezia, no hematuria, no nausea and no shortness of breath     Past Medical History:  Diagnosis Date  . BPH (benign prostatic hyperplasia)   . Diabetes mellitus without complication (HCC)   . Hypercholesteremia   . Hypertension     There are no active problems to display for this patient.   Past Surgical History:  Procedure Laterality  Date  . HERNIA REPAIR    . SHOULDER SURGERY Right   . SHOULDER SURGERY Left         Home Medications    Prior to Admission medications   Medication Sig Start Date End Date Taking? Authorizing Provider  acetaminophen (TYLENOL) 500 MG tablet Take 500 mg by mouth every 6 (six) hours as needed for mild pain or moderate pain.    [provider]  alum & mag hydroxide-simeth (MAALOX/MYLANTA) 200-200-20 MG/5ML suspension Take 15 mLs by mouth every 6 (six) hours as needed for indigestion or heartburn.    [provider]  aspirin EC 81 MG tablet Take 81 mg by mouth daily.    [provider]  cetirizine (ZYRTEC) 10 MG tablet Take 1 tablet by mouth daily. 12/31/17   [provider]  escitalopram (LEXAPRO) 10 MG tablet Take 10 mg by mouth daily.    [provider]  famotidine (PEPCID) 20 MG tablet Take 1 tablet (20 mg total) by mouth 2 (two) times daily. 04/17/17   Eber Hong, MD  Ginseng 250 MG CAPS Take 250 mg by mouth daily.    [provider]  glipiZIDE (GLUCOTROL XL) 5 MG 24 hr tablet Take 5 mg by mouth daily with breakfast.     [provider]  losartan-hydrochlorothiazide (HYZAAR) 100-25 MG per tablet Take 1 tablet by mouth daily.    [provider]  lovastatin (MEVACOR) 20 MG tablet Take 20 mg by mouth at bedtime.    [provider]  metFORMIN (GLUCOPHAGE-XR) 500 MG 24 hr tablet Take 2,000 mg by mouth daily.    [provider]  Multiple Vitamins-Minerals (MULTIVITAMIN GUMMIES MENS) CHEW Chew 2 Units by mouth daily.    [provider]  omeprazole (PRILOSEC) 20 MG capsule Take 20 mg by mouth daily.    [provider]  psyllium (METAMUCIL) 58.6 % packet Take 1 packet by mouth daily.    [provider]  tamsulosin (FLOMAX) 0.4 MG CAPS capsule Take 0.4 mg by mouth at bedtime.    [provider]  Tetrahydrozoline HCl (EYE DROPS OP) Apply 1 drop to eye daily as needed (FOR  DRY EYE/IRRITATION).    [provider]    Family History History reviewed. No pertinent family history.  Social History Social History   Tobacco Use  . Smoking status: Never Smoker  . Smokeless tobacco: Never Used  Substance Use Topics  . Alcohol use: No  . Drug use: No     Allergies   Codeine   Review of Systems Review of Systems  Constitutional: Negative for fever.  HENT: Positive for sore throat. Negative for sinus pain.   Eyes: Negative for visual disturbance.  Respiratory: Positive for cough. Negative for shortness of breath.   Cardiovascular: Positive for chest pain.  Gastrointestinal: Positive for abdominal pain and vomiting. Negative for anorexia, constipation, hematemesis, hematochezia and nausea.  Genitourinary: Negative for dysuria and hematuria.  Musculoskeletal: Negative for back pain.  Skin: Negative for rash.  Neurological: Positive for light-headedness (with coughing). Negative for syncope and headaches.     Physical Exam Updated Vital Signs BP 128/76 (BP Location: Right Arm)   Pulse 76   Temp 98.3 F (36.8 C) (Oral)   Resp 16   Ht 5\' 5"  (1.651 m)   Wt 76.2 kg   SpO2 97%   BMI 27.96 kg/m   Physical Exam Vitals signs and nursing note reviewed.  Constitutional:      Appearance: He is well-developed.  HENT:     Head: Normocephalic and atraumatic.  Eyes:     Conjunctiva/sclera: Conjunctivae normal.  Neck:     Musculoskeletal: Neck supple.  Cardiovascular:     Rate and Rhythm: Normal rate and regular rhythm.     Heart sounds: No murmur.  Pulmonary:     Effort: Pulmonary effort is normal. No respiratory distress.     Breath sounds: Normal breath sounds.  Abdominal:     General: There is distension.     Tenderness: There is generalized abdominal tenderness. There is no guarding or rebound.  Skin:    General: Skin is warm and dry.     Capillary Refill: Capillary refill takes less than 2 seconds.  Neurological:     General: No  focal deficit present.     Mental Status: He is alert and oriented to person, place, and time.      ED Treatments / Results  Labs (all labs ordered are listed, but only abnormal results are displayed) Labs Reviewed  COMPREHENSIVE METABOLIC PANEL - Abnormal; Notable for the following components:      Result Value   Glucose, Bld 172 (*)    All other components within normal limits  URINALYSIS, ROUTINE W REFLEX MICROSCOPIC - Abnormal; Notable for the following components:   Color, Urine STRAW (*)    All other components within normal limits  LIPASE, BLOOD  CBC  EKG EKG Interpretation  Date/Time:  Saturday July 31 2018 17:23:47 EST Ventricular Rate:  79 PR Interval:  124 QRS Duration: 86 QT Interval:  358 QTC Calculation: 410 R Axis:   58 Text Interpretation:  Normal sinus rhythm Cannot rule out Anterior infarct , age undetermined Abnormal ECG similar to prior 6/19 Confirmed by Meridee ScoreButler,  [phone]([id number]) on 07/31/2018 5:34:25 PM   Radiology Ct Chest W Contrast  Result Date: 07/31/2018 CLINICAL DATA:  Chest pain or SOB, pleurisy or effusion suspected; Abd pain, acute, generalized. EXAM: CT CHEST, ABDOMEN, AND PELVIS WITH CONTRAST TECHNIQUE: Multidetector CT imaging of the chest, abdomen and pelvis was performed following the standard protocol during bolus administration of intravenous contrast. CONTRAST:  100mL ISOVUE-300 IOPAMIDOL (ISOVUE-300) INJECTION 61% COMPARISON:  Chest radiograph 07/12/2018. Abdominal CT 04/10/2017 FINDINGS: CT CHEST FINDINGS Cardiovascular: No evidence of aortic dissection or pulmonary embolus. 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 Heart is normal in size with slight right heart dilatation. No pericardial effusion. Mediastinum/Nodes: No enlarged mediastinal or hilar lymph nodes. Esophagus is decompressed. No visualized thyroid nodule. Lungs/Pleura: No consolidation or pulmonary edema. No pleural fluid.  No pulmonary mass. Trace pleural thickening in the left lower lobe likely related scarring. Trachea and mainstem bronchi are patent. Musculoskeletal: Degenerative change in the spine in the right sternoclavicular joint. Ghost tracks in the right proximal humerus. There are no acute or suspicious osseous abnormalities. CT ABDOMEN PELVIS FINDINGS Hepatobiliary: Scattered small hepatic hypodensities are too small to characterize, but likely represent simple cysts. Small calcified granuloma in the caudate. Mild hepatic steatosis. Gallbladder physiologically distended, no calcified stone. No biliary dilatation. Pancreas: No ductal dilatation or inflammation. Spleen: Normal in size. Scattered calcified granuloma. Adrenals/Urinary Tract: No adrenal nodule. No hydronephrosis or perinephric edema. Homogeneous renal enhancement with symmetric excretion on delayed phase imaging. Small nonobstructing stones in the right kidney. Mild right renal cortical scarring. Small subcentimeter hypodensities in the left greater than right kidney are too small to characterize but likely cysts. Urinary bladder is physiologically distended. Mild wall thickening about the bladder base. Stomach/Bowel: The stomach is decompressed. Moderate to large duodenal diverticulum at the level of the pancreatic head. No associated inflammatory change. No small bowel obstruction, inflammatory change or evidence of wall thickening. Normal appendix. Colonic diverticulosis most prominent in the distal colon without acute diverticulitis. Vascular/Lymphatic: Mild aortic atherosclerosis without aneurysm or dissection. No enlarged lymph nodes in the abdomen or pelvis. Reproductive: Enlarged prostate gland spanning 5.8 cm. Other: Fat containing left inguinal hernia. No free air or free fluid. Prior ventral abdominal wall hernia repair with mesh. Musculoskeletal: Multilevel degenerative change in the lumbar spine with vacuum phenomena. There are no acute or  suspicious osseous abnormalities. IMPRESSION: 1. No acute abnormality in the chest, abdomen, or pelvis. 2. Nonobstructing right renal stones. Colonic diverticulosis without acute diverticulitis. 3. Enlarged prostate gland. Mild bladder base wall thickening is likely secondary to chronic bladder outlet obstruction. 4. Mild hepatic steatosis. Aortic Atherosclerosis (ICD10-I70.0). Electronically Signed   By: Narda RutherfordMelanie  Sanford M.D.   On: 07/31/2018 21:54   Ct Abdomen Pelvis W Contrast  Result Date: 07/31/2018 CLINICAL DATA:  Chest pain or SOB, pleurisy or effusion suspected; Abd pain, acute, generalized. EXAM: CT CHEST, ABDOMEN, AND PELVIS WITH CONTRAST TECHNIQUE: Multidetector CT imaging of the chest, abdomen and pelvis was performed following the standard protocol during bolus administration of intravenous contrast. CONTRAST:  100mL ISOVUE-300 IOPAMIDOL (ISOVUE-300) INJECTION 61% COMPARISON:  Chest radiograph 07/12/2018. Abdominal CT 04/10/2017 FINDINGS: CT CHEST  FINDINGS Cardiovascular: No evidence of aortic dissection or pulmonary embolus. 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 Heart is normal in size with slight right heart dilatation. No pericardial effusion. Mediastinum/Nodes: No enlarged mediastinal or hilar lymph nodes. Esophagus is decompressed. No visualized thyroid nodule. Lungs/Pleura: No consolidation or pulmonary edema. No pleural fluid. No pulmonary mass. Trace pleural thickening in the left lower lobe likely related scarring. Trachea and mainstem bronchi are patent. Musculoskeletal: Degenerative change in the spine in the right sternoclavicular joint. Ghost tracks in the right proximal humerus. There are no acute or suspicious osseous abnormalities. CT ABDOMEN PELVIS FINDINGS Hepatobiliary: Scattered small hepatic hypodensities are too small to characterize, but likely represent simple cysts. Small calcified granuloma in the caudate. Mild hepatic  steatosis. Gallbladder physiologically distended, no calcified stone. No biliary dilatation. Pancreas: No ductal dilatation or inflammation. Spleen: Normal in size. Scattered calcified granuloma. Adrenals/Urinary Tract: No adrenal nodule. No hydronephrosis or perinephric edema. Homogeneous renal enhancement with symmetric excretion on delayed phase imaging. Small nonobstructing stones in the right kidney. Mild right renal cortical scarring. Small subcentimeter hypodensities in the left greater than right kidney are too small to characterize but likely cysts. Urinary bladder is physiologically distended. Mild wall thickening about the bladder base. Stomach/Bowel: The stomach is decompressed. Moderate to large duodenal diverticulum at the level of the pancreatic head. No associated inflammatory change. No small bowel obstruction, inflammatory change or evidence of wall thickening. Normal appendix. Colonic diverticulosis most prominent in the distal colon without acute diverticulitis. Vascular/Lymphatic: Mild aortic atherosclerosis without aneurysm or dissection. No enlarged lymph nodes in the abdomen or pelvis. Reproductive: Enlarged prostate gland spanning 5.8 cm. Other: Fat containing left inguinal hernia. No free air or free fluid. Prior ventral abdominal wall hernia repair with mesh. Musculoskeletal: Multilevel degenerative change in the lumbar spine with vacuum phenomena. There are no acute or suspicious osseous abnormalities. IMPRESSION: 1. No acute abnormality in the chest, abdomen, or pelvis. 2. Nonobstructing right renal stones. Colonic diverticulosis without acute diverticulitis. 3. Enlarged prostate gland. Mild bladder base wall thickening is likely secondary to chronic bladder outlet obstruction. 4. Mild hepatic steatosis. Aortic Atherosclerosis (ICD10-I70.0). Electronically Signed   By: Narda Rutherford M.D.   On: 07/31/2018 21:54    Procedures Procedures (including critical care time)  Medications  Ordered in ED Medications  albuterol (PROVENTIL) (2.5 MG/3ML) 0.083% nebulizer solution 2.5 mg (2.5 mg Nebulization Given 07/31/18 2009)     Initial Impression / Assessment and Plan / ED Course  I have reviewed the triage vital signs and the nursing notes.  Pertinent labs & imaging results that were available during my care of the patient were reviewed by me and considered in my medical decision making (see chart for details).  Clinical Course as of Aug 02 1447  Sat Jul 31, 2018  2137 Patient here with some intermittent stabbing diffuse abdominal pain since yesterday.  On top of that he has had this ongoing cough for sounds like almost 2 months.  I do not see any work-up from the PCP but the patient has been in the emergency department at his PCPs office for this already.  Does not know much on exam but he definitely is coughing and spasms intermittently.  Tried albuterol inhaler.  His lab work is fairly unremarkable other than a mild bump in his glucose. I have put him in for a CT of his chest and abdomen.   [MB]  2144 He did say he had some relief  with the albuterol neb.    [MB]    Clinical Course User Index [MB] Terrilee Files, MD     Final Clinical Impressions(s) / ED Diagnoses   Final diagnoses:  Generalized abdominal pain  Acute bronchitis, unspecified organism    ED Discharge Orders    None       Terrilee Files, MD 08/01/18 [id number]

## 2018-07-31 NOTE — ED Triage Notes (Signed)
Pt c/o sharp abd pain since last night. Denies Gu/ D/. V/ X2 today. Was dx with bronchitis several weeks ago and feels no better.

## 2018-07-31 NOTE — ED Notes (Signed)
Pt returned from CT at this time.  

## 2018-08-20 DIAGNOSIS — R05 Cough: Secondary | ICD-10-CM | POA: Diagnosis not present

## 2018-08-20 DIAGNOSIS — Z6827 Body mass index (BMI) 27.0-27.9, adult: Secondary | ICD-10-CM | POA: Diagnosis not present

## 2018-09-08 ENCOUNTER — Ambulatory Visit: Payer: Self-pay | Admitting: Allergy & Immunology

## 2018-09-14 DIAGNOSIS — R69 Illness, unspecified: Secondary | ICD-10-CM | POA: Diagnosis not present

## 2018-09-15 DIAGNOSIS — E785 Hyperlipidemia, unspecified: Secondary | ICD-10-CM | POA: Diagnosis not present

## 2018-09-15 DIAGNOSIS — Z79899 Other long term (current) drug therapy: Secondary | ICD-10-CM | POA: Diagnosis not present

## 2018-09-15 DIAGNOSIS — K529 Noninfective gastroenteritis and colitis, unspecified: Secondary | ICD-10-CM | POA: Diagnosis not present

## 2018-09-15 DIAGNOSIS — R42 Dizziness and giddiness: Secondary | ICD-10-CM | POA: Diagnosis not present

## 2018-09-15 DIAGNOSIS — Z7984 Long term (current) use of oral hypoglycemic drugs: Secondary | ICD-10-CM | POA: Diagnosis not present

## 2018-09-15 DIAGNOSIS — I10 Essential (primary) hypertension: Secondary | ICD-10-CM | POA: Diagnosis not present

## 2018-09-15 DIAGNOSIS — K5730 Diverticulosis of large intestine without perforation or abscess without bleeding: Secondary | ICD-10-CM | POA: Diagnosis not present

## 2018-09-15 DIAGNOSIS — E119 Type 2 diabetes mellitus without complications: Secondary | ICD-10-CM | POA: Diagnosis not present

## 2018-09-15 DIAGNOSIS — K760 Fatty (change of) liver, not elsewhere classified: Secondary | ICD-10-CM | POA: Diagnosis not present

## 2018-09-15 DIAGNOSIS — R1033 Periumbilical pain: Secondary | ICD-10-CM | POA: Diagnosis not present

## 2018-09-15 DIAGNOSIS — R51 Headache: Secondary | ICD-10-CM | POA: Diagnosis not present

## 2018-09-15 DIAGNOSIS — Z7982 Long term (current) use of aspirin: Secondary | ICD-10-CM | POA: Diagnosis not present

## 2018-09-17 DIAGNOSIS — R69 Illness, unspecified: Secondary | ICD-10-CM | POA: Diagnosis not present

## 2018-09-17 DIAGNOSIS — G4733 Obstructive sleep apnea (adult) (pediatric): Secondary | ICD-10-CM | POA: Diagnosis not present

## 2018-09-17 DIAGNOSIS — R5382 Chronic fatigue, unspecified: Secondary | ICD-10-CM | POA: Diagnosis not present

## 2018-09-17 DIAGNOSIS — E1122 Type 2 diabetes mellitus with diabetic chronic kidney disease: Secondary | ICD-10-CM | POA: Diagnosis not present

## 2018-09-17 DIAGNOSIS — E1142 Type 2 diabetes mellitus with diabetic polyneuropathy: Secondary | ICD-10-CM | POA: Diagnosis not present

## 2018-09-17 DIAGNOSIS — K219 Gastro-esophageal reflux disease without esophagitis: Secondary | ICD-10-CM | POA: Diagnosis not present

## 2018-09-17 DIAGNOSIS — I10 Essential (primary) hypertension: Secondary | ICD-10-CM | POA: Diagnosis not present

## 2018-09-17 DIAGNOSIS — E782 Mixed hyperlipidemia: Secondary | ICD-10-CM | POA: Diagnosis not present

## 2018-09-17 DIAGNOSIS — E119 Type 2 diabetes mellitus without complications: Secondary | ICD-10-CM | POA: Diagnosis not present

## 2018-09-17 DIAGNOSIS — E1165 Type 2 diabetes mellitus with hyperglycemia: Secondary | ICD-10-CM | POA: Diagnosis not present

## 2018-09-17 DIAGNOSIS — N182 Chronic kidney disease, stage 2 (mild): Secondary | ICD-10-CM | POA: Diagnosis not present

## 2018-09-22 DIAGNOSIS — M545 Low back pain: Secondary | ICD-10-CM | POA: Diagnosis not present

## 2018-09-22 DIAGNOSIS — N400 Enlarged prostate without lower urinary tract symptoms: Secondary | ICD-10-CM | POA: Diagnosis not present

## 2018-09-22 DIAGNOSIS — E1165 Type 2 diabetes mellitus with hyperglycemia: Secondary | ICD-10-CM | POA: Diagnosis not present

## 2018-09-22 DIAGNOSIS — J301 Allergic rhinitis due to pollen: Secondary | ICD-10-CM | POA: Diagnosis not present

## 2018-09-22 DIAGNOSIS — Z6827 Body mass index (BMI) 27.0-27.9, adult: Secondary | ICD-10-CM | POA: Diagnosis not present

## 2018-09-22 DIAGNOSIS — E1122 Type 2 diabetes mellitus with diabetic chronic kidney disease: Secondary | ICD-10-CM | POA: Diagnosis not present

## 2018-09-22 DIAGNOSIS — I10 Essential (primary) hypertension: Secondary | ICD-10-CM | POA: Diagnosis not present

## 2018-09-22 DIAGNOSIS — E782 Mixed hyperlipidemia: Secondary | ICD-10-CM | POA: Diagnosis not present

## 2018-12-03 DIAGNOSIS — R1110 Vomiting, unspecified: Secondary | ICD-10-CM | POA: Diagnosis not present

## 2018-12-03 DIAGNOSIS — R109 Unspecified abdominal pain: Secondary | ICD-10-CM | POA: Diagnosis not present

## 2018-12-08 DIAGNOSIS — R109 Unspecified abdominal pain: Secondary | ICD-10-CM | POA: Diagnosis not present

## 2018-12-08 DIAGNOSIS — R1110 Vomiting, unspecified: Secondary | ICD-10-CM | POA: Diagnosis not present

## 2019-01-17 DIAGNOSIS — R69 Illness, unspecified: Secondary | ICD-10-CM | POA: Diagnosis not present

## 2019-01-19 DIAGNOSIS — E119 Type 2 diabetes mellitus without complications: Secondary | ICD-10-CM | POA: Diagnosis not present

## 2019-01-19 DIAGNOSIS — H40013 Open angle with borderline findings, low risk, bilateral: Secondary | ICD-10-CM | POA: Diagnosis not present

## 2019-01-19 DIAGNOSIS — H25813 Combined forms of age-related cataract, bilateral: Secondary | ICD-10-CM | POA: Diagnosis not present

## 2019-01-24 DIAGNOSIS — G4733 Obstructive sleep apnea (adult) (pediatric): Secondary | ICD-10-CM | POA: Diagnosis not present

## 2019-01-24 DIAGNOSIS — E1142 Type 2 diabetes mellitus with diabetic polyneuropathy: Secondary | ICD-10-CM | POA: Diagnosis not present

## 2019-01-24 DIAGNOSIS — K219 Gastro-esophageal reflux disease without esophagitis: Secondary | ICD-10-CM | POA: Diagnosis not present

## 2019-01-24 DIAGNOSIS — E782 Mixed hyperlipidemia: Secondary | ICD-10-CM | POA: Diagnosis not present

## 2019-01-24 DIAGNOSIS — R5382 Chronic fatigue, unspecified: Secondary | ICD-10-CM | POA: Diagnosis not present

## 2019-01-24 DIAGNOSIS — E1165 Type 2 diabetes mellitus with hyperglycemia: Secondary | ICD-10-CM | POA: Diagnosis not present

## 2019-01-24 DIAGNOSIS — I10 Essential (primary) hypertension: Secondary | ICD-10-CM | POA: Diagnosis not present

## 2019-01-24 DIAGNOSIS — E1122 Type 2 diabetes mellitus with diabetic chronic kidney disease: Secondary | ICD-10-CM | POA: Diagnosis not present

## 2019-01-28 DIAGNOSIS — N400 Enlarged prostate without lower urinary tract symptoms: Secondary | ICD-10-CM | POA: Diagnosis not present

## 2019-01-28 DIAGNOSIS — Z23 Encounter for immunization: Secondary | ICD-10-CM | POA: Diagnosis not present

## 2019-01-28 DIAGNOSIS — Z1212 Encounter for screening for malignant neoplasm of rectum: Secondary | ICD-10-CM | POA: Diagnosis not present

## 2019-01-28 DIAGNOSIS — Z0001 Encounter for general adult medical examination with abnormal findings: Secondary | ICD-10-CM | POA: Diagnosis not present

## 2019-01-28 DIAGNOSIS — I10 Essential (primary) hypertension: Secondary | ICD-10-CM | POA: Diagnosis not present

## 2019-01-28 DIAGNOSIS — E1165 Type 2 diabetes mellitus with hyperglycemia: Secondary | ICD-10-CM | POA: Diagnosis not present

## 2019-01-28 DIAGNOSIS — E1122 Type 2 diabetes mellitus with diabetic chronic kidney disease: Secondary | ICD-10-CM | POA: Diagnosis not present

## 2019-01-28 DIAGNOSIS — Z6828 Body mass index (BMI) 28.0-28.9, adult: Secondary | ICD-10-CM | POA: Diagnosis not present

## 2019-02-04 DIAGNOSIS — H2513 Age-related nuclear cataract, bilateral: Secondary | ICD-10-CM | POA: Diagnosis not present

## 2019-02-04 DIAGNOSIS — I10 Essential (primary) hypertension: Secondary | ICD-10-CM | POA: Diagnosis not present

## 2019-02-04 DIAGNOSIS — E1165 Type 2 diabetes mellitus with hyperglycemia: Secondary | ICD-10-CM | POA: Diagnosis not present

## 2019-02-11 DIAGNOSIS — H25812 Combined forms of age-related cataract, left eye: Secondary | ICD-10-CM | POA: Diagnosis not present

## 2019-03-07 DIAGNOSIS — E7800 Pure hypercholesterolemia, unspecified: Secondary | ICD-10-CM | POA: Diagnosis not present

## 2019-03-07 DIAGNOSIS — I10 Essential (primary) hypertension: Secondary | ICD-10-CM | POA: Diagnosis not present

## 2019-03-07 DIAGNOSIS — E1165 Type 2 diabetes mellitus with hyperglycemia: Secondary | ICD-10-CM | POA: Diagnosis not present

## 2019-05-06 DIAGNOSIS — I10 Essential (primary) hypertension: Secondary | ICD-10-CM | POA: Diagnosis not present

## 2019-05-20 DIAGNOSIS — R69 Illness, unspecified: Secondary | ICD-10-CM | POA: Diagnosis not present

## 2019-05-20 DIAGNOSIS — R42 Dizziness and giddiness: Secondary | ICD-10-CM | POA: Diagnosis not present

## 2019-05-20 DIAGNOSIS — E1122 Type 2 diabetes mellitus with diabetic chronic kidney disease: Secondary | ICD-10-CM | POA: Diagnosis not present

## 2019-05-20 DIAGNOSIS — E782 Mixed hyperlipidemia: Secondary | ICD-10-CM | POA: Diagnosis not present

## 2019-05-20 DIAGNOSIS — E119 Type 2 diabetes mellitus without complications: Secondary | ICD-10-CM | POA: Diagnosis not present

## 2019-05-20 DIAGNOSIS — R5382 Chronic fatigue, unspecified: Secondary | ICD-10-CM | POA: Diagnosis not present

## 2019-05-20 DIAGNOSIS — I10 Essential (primary) hypertension: Secondary | ICD-10-CM | POA: Diagnosis not present

## 2019-05-20 DIAGNOSIS — K219 Gastro-esophageal reflux disease without esophagitis: Secondary | ICD-10-CM | POA: Diagnosis not present

## 2019-05-30 DIAGNOSIS — M545 Low back pain: Secondary | ICD-10-CM | POA: Diagnosis not present

## 2019-05-30 DIAGNOSIS — Z6828 Body mass index (BMI) 28.0-28.9, adult: Secondary | ICD-10-CM | POA: Diagnosis not present

## 2019-05-30 DIAGNOSIS — N400 Enlarged prostate without lower urinary tract symptoms: Secondary | ICD-10-CM | POA: Diagnosis not present

## 2019-05-30 DIAGNOSIS — E1122 Type 2 diabetes mellitus with diabetic chronic kidney disease: Secondary | ICD-10-CM | POA: Diagnosis not present

## 2019-05-30 DIAGNOSIS — G4733 Obstructive sleep apnea (adult) (pediatric): Secondary | ICD-10-CM | POA: Diagnosis not present

## 2019-05-30 DIAGNOSIS — J301 Allergic rhinitis due to pollen: Secondary | ICD-10-CM | POA: Diagnosis not present

## 2019-05-30 DIAGNOSIS — E1165 Type 2 diabetes mellitus with hyperglycemia: Secondary | ICD-10-CM | POA: Diagnosis not present

## 2019-05-30 DIAGNOSIS — I10 Essential (primary) hypertension: Secondary | ICD-10-CM | POA: Diagnosis not present

## 2019-06-10 ENCOUNTER — Other Ambulatory Visit (HOSPITAL_BASED_OUTPATIENT_CLINIC_OR_DEPARTMENT_OTHER): Payer: Self-pay

## 2019-06-10 DIAGNOSIS — R5383 Other fatigue: Secondary | ICD-10-CM

## 2019-06-10 DIAGNOSIS — G4710 Hypersomnia, unspecified: Secondary | ICD-10-CM

## 2019-06-10 DIAGNOSIS — R0683 Snoring: Secondary | ICD-10-CM

## 2019-06-24 ENCOUNTER — Other Ambulatory Visit: Payer: Self-pay

## 2019-06-24 ENCOUNTER — Other Ambulatory Visit (HOSPITAL_COMMUNITY): Payer: Medicare HMO

## 2019-06-24 ENCOUNTER — Other Ambulatory Visit (HOSPITAL_COMMUNITY)
Admission: RE | Admit: 2019-06-24 | Discharge: 2019-06-24 | Disposition: A | Discharge status: Home / Self Care | Payer: Medicare HMO | Source: Ambulatory Visit | Attending: Neurology | Admitting: Neurology

## 2019-06-24 DIAGNOSIS — Z20828 Contact with and (suspected) exposure to other viral communicable diseases: Secondary | ICD-10-CM | POA: Diagnosis not present

## 2019-06-24 DIAGNOSIS — Z01812 Encounter for preprocedural laboratory examination: Secondary | ICD-10-CM | POA: Diagnosis not present

## 2019-06-24 LAB — SARS CORONAVIRUS 2 (TAT 6-24 HRS): SARS Coronavirus 2: NEGATIVE

## 2019-06-28 ENCOUNTER — Other Ambulatory Visit: Payer: Self-pay

## 2019-06-28 ENCOUNTER — Ambulatory Visit (HOSPITAL_BASED_OUTPATIENT_CLINIC_OR_DEPARTMENT_OTHER): Payer: Medicare HMO | Attending: Family Medicine | Admitting: Internal Medicine

## 2019-06-28 DIAGNOSIS — I493 Ventricular premature depolarization: Secondary | ICD-10-CM | POA: Insufficient documentation

## 2019-06-28 DIAGNOSIS — R5383 Other fatigue: Secondary | ICD-10-CM | POA: Insufficient documentation

## 2019-06-28 DIAGNOSIS — G4710 Hypersomnia, unspecified: Secondary | ICD-10-CM | POA: Diagnosis not present

## 2019-06-28 DIAGNOSIS — R0683 Snoring: Secondary | ICD-10-CM | POA: Diagnosis not present

## 2019-06-29 ENCOUNTER — Other Ambulatory Visit (HOSPITAL_BASED_OUTPATIENT_CLINIC_OR_DEPARTMENT_OTHER): Payer: Self-pay

## 2019-06-29 DIAGNOSIS — R5383 Other fatigue: Secondary | ICD-10-CM

## 2019-06-29 DIAGNOSIS — R0683 Snoring: Secondary | ICD-10-CM

## 2019-06-29 DIAGNOSIS — G4710 Hypersomnia, unspecified: Secondary | ICD-10-CM

## 2019-07-02 DIAGNOSIS — R0683 Snoring: Secondary | ICD-10-CM | POA: Diagnosis not present

## 2019-07-02 NOTE — Procedures (Signed)
     Patient Name: Wayne Calhoun, Wayne Calhoun Date: 06/28/2019 Gender: Male D.O.B: Sep 26, 1940 Age (years): 78 Referring Provider: Caryl Bis Height (inches): 60 Interpreting Physician: Baird Lyons MD, ABSM Weight (lbs): 171 RPSGT: Laren Everts BMI: 28 MRN: [id number] Neck Size: 15.00  CLINICAL INFORMATION Sleep Study Type: NPSG  Indication for sleep study: Diabetes, Excessive Daytime Sleepiness, Hypertension, OSA, Sleep walking/talking/parasomnias, Snoring, Witnessed Apneas Epworth Sleepiness Score: 14  SLEEP STUDY TECHNIQUE As per the AASM Manual for the Scoring of Sleep and Associated Events v2.3 (April 2016) with a hypopnea requiring 4% desaturations.  The channels recorded and monitored were frontal, central and occipital EEG, electrooculogram (EOG), submentalis EMG (chin), nasal and oral airflow, thoracic and abdominal wall motion, anterior tibialis EMG, snore microphone, electrocardiogram, and pulse oximetry.  MEDICATIONS Medications self-administered by patient taken the night of the study : none reported  SLEEP ARCHITECTURE The study was initiated at 9:39:48 PM and ended at 4:41:29 AM.  Sleep onset time was 6.9 minutes and the sleep efficiency was 88.8%%. The total sleep time was 374.5 minutes.  Stage REM latency was 44.5 minutes.  The patient spent 16.4%% of the night in stage N1 sleep, 61.9%% in stage N2 sleep, 0.0%% in stage N3 and 21.6% in REM.  Alpha intrusion was absent.  Supine sleep was 16.96%.  RESPIRATORY PARAMETERS The overall apnea/hypopnea index (AHI) was 1.9 per hour. There were 4 total apneas, including 0 obstructive, 4 central and 0 mixed apneas. There were 8 hypopneas and 18 RERAs.  The AHI during Stage REM sleep was 3.7 per hour.  AHI while supine was 6.6 per hour.  The mean oxygen saturation was 95.0%. The minimum SpO2 during sleep was 90.0%.  soft snoring was noted during this study.  CARDIAC DATA The 2 lead EKG demonstrated  sinus rhythm. The mean heart rate was 61.2 beats per minute. Other EKG findings include: PVCs.  LEG MOVEMENT DATA The total PLMS were 0 with a resulting PLMS index of 0.0. Associated arousal with leg movement index was 0.0 .  IMPRESSIONS - No significant obstructive sleep apnea occurred during this study (AHI = 1.9/h). - No significant central sleep apnea occurred during this study (CAI = 0.6/h). - The patient had minimal or no oxygen desaturation during the study (Min O2 = 90.0%) - The patient snored with soft snoring volume. - EKG findings include PVCs. - Clinically significant periodic limb movements did not occur during sleep. No significant associated arousal.  DIAGNOSIS - Normal study  RECOMMENDATIONS - Manage symptoms based on clinical judgment. - Sleep hygiene should be reviewed to assess factors that may improve sleep quality. - Weight management and regular exercise should be initiated or continued if appropriate.  [Electronically signed] 07/02/2019 01:04 PM  Baird Lyons MD, [place], American Board of Sleep Medicine   NPI: [id number]                        [place], [place] [facility]  ELECTRONICALLY SIGNED ON:  07/02/2019, 1:04 PM [place] PH: [phone]   FX: [phone] [place]

## 2019-07-07 DIAGNOSIS — E782 Mixed hyperlipidemia: Secondary | ICD-10-CM | POA: Diagnosis not present

## 2019-07-07 DIAGNOSIS — I10 Essential (primary) hypertension: Secondary | ICD-10-CM | POA: Diagnosis not present

## 2019-08-17 DIAGNOSIS — R69 Illness, unspecified: Secondary | ICD-10-CM | POA: Diagnosis not present

## 2019-09-02 DIAGNOSIS — I10 Essential (primary) hypertension: Secondary | ICD-10-CM | POA: Diagnosis not present

## 2019-09-02 DIAGNOSIS — E7849 Other hyperlipidemia: Secondary | ICD-10-CM | POA: Diagnosis not present

## 2019-09-15 DIAGNOSIS — R946 Abnormal results of thyroid function studies: Secondary | ICD-10-CM | POA: Diagnosis not present

## 2019-09-15 DIAGNOSIS — I10 Essential (primary) hypertension: Secondary | ICD-10-CM | POA: Diagnosis not present

## 2019-09-15 DIAGNOSIS — E782 Mixed hyperlipidemia: Secondary | ICD-10-CM | POA: Diagnosis not present

## 2019-09-15 DIAGNOSIS — E1122 Type 2 diabetes mellitus with diabetic chronic kidney disease: Secondary | ICD-10-CM | POA: Diagnosis not present

## 2019-09-15 DIAGNOSIS — E1142 Type 2 diabetes mellitus with diabetic polyneuropathy: Secondary | ICD-10-CM | POA: Diagnosis not present

## 2019-09-15 DIAGNOSIS — K219 Gastro-esophageal reflux disease without esophagitis: Secondary | ICD-10-CM | POA: Diagnosis not present

## 2019-09-15 DIAGNOSIS — E1165 Type 2 diabetes mellitus with hyperglycemia: Secondary | ICD-10-CM | POA: Diagnosis not present

## 2019-09-19 DIAGNOSIS — Z961 Presence of intraocular lens: Secondary | ICD-10-CM | POA: Diagnosis not present

## 2019-09-19 DIAGNOSIS — H25811 Combined forms of age-related cataract, right eye: Secondary | ICD-10-CM | POA: Diagnosis not present

## 2019-09-19 DIAGNOSIS — E119 Type 2 diabetes mellitus without complications: Secondary | ICD-10-CM | POA: Diagnosis not present

## 2019-09-19 DIAGNOSIS — H40013 Open angle with borderline findings, low risk, bilateral: Secondary | ICD-10-CM | POA: Diagnosis not present

## 2019-09-21 DIAGNOSIS — Z6828 Body mass index (BMI) 28.0-28.9, adult: Secondary | ICD-10-CM | POA: Diagnosis not present

## 2019-09-21 DIAGNOSIS — G4733 Obstructive sleep apnea (adult) (pediatric): Secondary | ICD-10-CM | POA: Diagnosis not present

## 2019-09-21 DIAGNOSIS — I10 Essential (primary) hypertension: Secondary | ICD-10-CM | POA: Diagnosis not present

## 2019-09-21 DIAGNOSIS — E1165 Type 2 diabetes mellitus with hyperglycemia: Secondary | ICD-10-CM | POA: Diagnosis not present

## 2019-09-21 DIAGNOSIS — N400 Enlarged prostate without lower urinary tract symptoms: Secondary | ICD-10-CM | POA: Diagnosis not present

## 2019-09-21 DIAGNOSIS — E1122 Type 2 diabetes mellitus with diabetic chronic kidney disease: Secondary | ICD-10-CM | POA: Diagnosis not present

## 2019-09-21 DIAGNOSIS — J301 Allergic rhinitis due to pollen: Secondary | ICD-10-CM | POA: Diagnosis not present

## 2019-09-21 DIAGNOSIS — E782 Mixed hyperlipidemia: Secondary | ICD-10-CM | POA: Diagnosis not present

## 2019-09-30 DIAGNOSIS — R69 Illness, unspecified: Secondary | ICD-10-CM | POA: Diagnosis not present

## 2019-10-05 DIAGNOSIS — E7849 Other hyperlipidemia: Secondary | ICD-10-CM | POA: Diagnosis not present

## 2019-10-05 DIAGNOSIS — I10 Essential (primary) hypertension: Secondary | ICD-10-CM | POA: Diagnosis not present

## 2019-11-02 DIAGNOSIS — H2511 Age-related nuclear cataract, right eye: Secondary | ICD-10-CM | POA: Diagnosis not present

## 2019-11-04 DIAGNOSIS — H25811 Combined forms of age-related cataract, right eye: Secondary | ICD-10-CM | POA: Diagnosis not present

## 2019-11-04 DIAGNOSIS — I10 Essential (primary) hypertension: Secondary | ICD-10-CM | POA: Diagnosis not present

## 2019-11-04 DIAGNOSIS — E7849 Other hyperlipidemia: Secondary | ICD-10-CM | POA: Diagnosis not present

## 2019-11-04 DIAGNOSIS — K219 Gastro-esophageal reflux disease without esophagitis: Secondary | ICD-10-CM | POA: Diagnosis not present

## 2019-12-03 DIAGNOSIS — E1165 Type 2 diabetes mellitus with hyperglycemia: Secondary | ICD-10-CM | POA: Diagnosis not present

## 2019-12-03 DIAGNOSIS — K219 Gastro-esophageal reflux disease without esophagitis: Secondary | ICD-10-CM | POA: Diagnosis not present

## 2019-12-03 DIAGNOSIS — Z7984 Long term (current) use of oral hypoglycemic drugs: Secondary | ICD-10-CM | POA: Diagnosis not present

## 2019-12-03 DIAGNOSIS — Z7982 Long term (current) use of aspirin: Secondary | ICD-10-CM | POA: Diagnosis not present

## 2019-12-03 DIAGNOSIS — I10 Essential (primary) hypertension: Secondary | ICD-10-CM | POA: Diagnosis not present

## 2019-12-03 DIAGNOSIS — N400 Enlarged prostate without lower urinary tract symptoms: Secondary | ICD-10-CM | POA: Diagnosis not present

## 2019-12-03 DIAGNOSIS — M1990 Unspecified osteoarthritis, unspecified site: Secondary | ICD-10-CM | POA: Diagnosis not present

## 2019-12-03 DIAGNOSIS — E785 Hyperlipidemia, unspecified: Secondary | ICD-10-CM | POA: Diagnosis not present

## 2019-12-03 DIAGNOSIS — R69 Illness, unspecified: Secondary | ICD-10-CM | POA: Diagnosis not present

## 2019-12-03 DIAGNOSIS — Z809 Family history of malignant neoplasm, unspecified: Secondary | ICD-10-CM | POA: Diagnosis not present

## 2019-12-05 DIAGNOSIS — E7849 Other hyperlipidemia: Secondary | ICD-10-CM | POA: Diagnosis not present

## 2019-12-05 DIAGNOSIS — I129 Hypertensive chronic kidney disease with stage 1 through stage 4 chronic kidney disease, or unspecified chronic kidney disease: Secondary | ICD-10-CM | POA: Diagnosis not present

## 2019-12-05 DIAGNOSIS — N182 Chronic kidney disease, stage 2 (mild): Secondary | ICD-10-CM | POA: Diagnosis not present

## 2019-12-05 DIAGNOSIS — E1122 Type 2 diabetes mellitus with diabetic chronic kidney disease: Secondary | ICD-10-CM | POA: Diagnosis not present

## 2020-01-04 DIAGNOSIS — E1122 Type 2 diabetes mellitus with diabetic chronic kidney disease: Secondary | ICD-10-CM | POA: Diagnosis not present

## 2020-01-04 DIAGNOSIS — E7849 Other hyperlipidemia: Secondary | ICD-10-CM | POA: Diagnosis not present

## 2020-01-04 DIAGNOSIS — N182 Chronic kidney disease, stage 2 (mild): Secondary | ICD-10-CM | POA: Diagnosis not present

## 2020-01-04 DIAGNOSIS — I129 Hypertensive chronic kidney disease with stage 1 through stage 4 chronic kidney disease, or unspecified chronic kidney disease: Secondary | ICD-10-CM | POA: Diagnosis not present

## 2020-01-10 DIAGNOSIS — R69 Illness, unspecified: Secondary | ICD-10-CM | POA: Diagnosis not present

## 2020-01-12 DIAGNOSIS — Z0100 Encounter for examination of eyes and vision without abnormal findings: Secondary | ICD-10-CM | POA: Diagnosis not present

## 2020-01-27 DIAGNOSIS — R5382 Chronic fatigue, unspecified: Secondary | ICD-10-CM | POA: Diagnosis not present

## 2020-01-27 DIAGNOSIS — E1142 Type 2 diabetes mellitus with diabetic polyneuropathy: Secondary | ICD-10-CM | POA: Diagnosis not present

## 2020-01-27 DIAGNOSIS — K219 Gastro-esophageal reflux disease without esophagitis: Secondary | ICD-10-CM | POA: Diagnosis not present

## 2020-01-27 DIAGNOSIS — E782 Mixed hyperlipidemia: Secondary | ICD-10-CM | POA: Diagnosis not present

## 2020-01-27 DIAGNOSIS — E1165 Type 2 diabetes mellitus with hyperglycemia: Secondary | ICD-10-CM | POA: Diagnosis not present

## 2020-01-27 DIAGNOSIS — I10 Essential (primary) hypertension: Secondary | ICD-10-CM | POA: Diagnosis not present

## 2020-01-27 DIAGNOSIS — Z9189 Other specified personal risk factors, not elsewhere classified: Secondary | ICD-10-CM | POA: Diagnosis not present

## 2020-01-27 DIAGNOSIS — E1122 Type 2 diabetes mellitus with diabetic chronic kidney disease: Secondary | ICD-10-CM | POA: Diagnosis not present

## 2020-01-27 DIAGNOSIS — N182 Chronic kidney disease, stage 2 (mild): Secondary | ICD-10-CM | POA: Diagnosis not present

## 2020-01-31 DIAGNOSIS — Z0001 Encounter for general adult medical examination with abnormal findings: Secondary | ICD-10-CM | POA: Diagnosis not present

## 2020-01-31 DIAGNOSIS — M25562 Pain in left knee: Secondary | ICD-10-CM | POA: Diagnosis not present

## 2020-01-31 DIAGNOSIS — E1122 Type 2 diabetes mellitus with diabetic chronic kidney disease: Secondary | ICD-10-CM | POA: Diagnosis not present

## 2020-01-31 DIAGNOSIS — N400 Enlarged prostate without lower urinary tract symptoms: Secondary | ICD-10-CM | POA: Diagnosis not present

## 2020-01-31 DIAGNOSIS — I10 Essential (primary) hypertension: Secondary | ICD-10-CM | POA: Diagnosis not present

## 2020-01-31 DIAGNOSIS — Z23 Encounter for immunization: Secondary | ICD-10-CM | POA: Diagnosis not present

## 2020-01-31 DIAGNOSIS — E1165 Type 2 diabetes mellitus with hyperglycemia: Secondary | ICD-10-CM | POA: Diagnosis not present

## 2020-01-31 DIAGNOSIS — E782 Mixed hyperlipidemia: Secondary | ICD-10-CM | POA: Diagnosis not present

## 2020-02-03 DIAGNOSIS — I129 Hypertensive chronic kidney disease with stage 1 through stage 4 chronic kidney disease, or unspecified chronic kidney disease: Secondary | ICD-10-CM | POA: Diagnosis not present

## 2020-02-03 DIAGNOSIS — N182 Chronic kidney disease, stage 2 (mild): Secondary | ICD-10-CM | POA: Diagnosis not present

## 2020-02-03 DIAGNOSIS — E1122 Type 2 diabetes mellitus with diabetic chronic kidney disease: Secondary | ICD-10-CM | POA: Diagnosis not present

## 2020-02-03 DIAGNOSIS — E7849 Other hyperlipidemia: Secondary | ICD-10-CM | POA: Diagnosis not present

## 2020-02-08 DIAGNOSIS — K5750 Diverticulosis of both small and large intestine without perforation or abscess without bleeding: Secondary | ICD-10-CM | POA: Diagnosis not present

## 2020-02-08 DIAGNOSIS — N320 Bladder-neck obstruction: Secondary | ICD-10-CM | POA: Diagnosis not present

## 2020-02-08 DIAGNOSIS — K219 Gastro-esophageal reflux disease without esophagitis: Secondary | ICD-10-CM | POA: Diagnosis not present

## 2020-02-08 DIAGNOSIS — N401 Enlarged prostate with lower urinary tract symptoms: Secondary | ICD-10-CM | POA: Diagnosis not present

## 2020-02-08 DIAGNOSIS — K625 Hemorrhage of anus and rectum: Secondary | ICD-10-CM | POA: Diagnosis not present

## 2020-02-08 DIAGNOSIS — D649 Anemia, unspecified: Secondary | ICD-10-CM | POA: Diagnosis not present

## 2020-02-08 DIAGNOSIS — E119 Type 2 diabetes mellitus without complications: Secondary | ICD-10-CM | POA: Diagnosis not present

## 2020-02-08 DIAGNOSIS — I10 Essential (primary) hypertension: Secondary | ICD-10-CM | POA: Diagnosis not present

## 2020-02-08 DIAGNOSIS — Z20822 Contact with and (suspected) exposure to covid-19: Secondary | ICD-10-CM | POA: Diagnosis not present

## 2020-02-08 DIAGNOSIS — E785 Hyperlipidemia, unspecified: Secondary | ICD-10-CM | POA: Diagnosis not present

## 2020-02-08 DIAGNOSIS — N400 Enlarged prostate without lower urinary tract symptoms: Secondary | ICD-10-CM | POA: Diagnosis not present

## 2020-02-08 DIAGNOSIS — I700 Atherosclerosis of aorta: Secondary | ICD-10-CM | POA: Diagnosis not present

## 2020-02-09 ENCOUNTER — Other Ambulatory Visit: Payer: Self-pay

## 2020-02-09 ENCOUNTER — Inpatient Hospital Stay (HOSPITAL_COMMUNITY)
Admission: EM | Admit: 2020-02-09 | Discharge: 2020-02-13 | DRG: 379 | Disposition: A | Discharge status: Home / Self Care | Payer: Medicare HMO | Source: Other Acute Inpatient Hospital | Attending: Internal Medicine | Admitting: Internal Medicine

## 2020-02-09 ENCOUNTER — Encounter (HOSPITAL_COMMUNITY): Payer: Self-pay | Admitting: Internal Medicine

## 2020-02-09 DIAGNOSIS — K922 Gastrointestinal hemorrhage, unspecified: Secondary | ICD-10-CM | POA: Diagnosis not present

## 2020-02-09 DIAGNOSIS — Z7982 Long term (current) use of aspirin: Secondary | ICD-10-CM

## 2020-02-09 DIAGNOSIS — Z79899 Other long term (current) drug therapy: Secondary | ICD-10-CM

## 2020-02-09 DIAGNOSIS — D649 Anemia, unspecified: Secondary | ICD-10-CM | POA: Diagnosis not present

## 2020-02-09 DIAGNOSIS — Z7984 Long term (current) use of oral hypoglycemic drugs: Secondary | ICD-10-CM

## 2020-02-09 DIAGNOSIS — R69 Illness, unspecified: Secondary | ICD-10-CM | POA: Diagnosis not present

## 2020-02-09 DIAGNOSIS — K5900 Constipation, unspecified: Secondary | ICD-10-CM | POA: Diagnosis present

## 2020-02-09 DIAGNOSIS — Z8711 Personal history of peptic ulcer disease: Secondary | ICD-10-CM

## 2020-02-09 DIAGNOSIS — K5711 Diverticulosis of small intestine without perforation or abscess with bleeding: Secondary | ICD-10-CM | POA: Diagnosis not present

## 2020-02-09 DIAGNOSIS — K921 Melena: Secondary | ICD-10-CM | POA: Diagnosis not present

## 2020-02-09 DIAGNOSIS — E7800 Pure hypercholesterolemia, unspecified: Secondary | ICD-10-CM | POA: Diagnosis not present

## 2020-02-09 DIAGNOSIS — K5790 Diverticulosis of intestine, part unspecified, without perforation or abscess without bleeding: Secondary | ICD-10-CM | POA: Diagnosis not present

## 2020-02-09 DIAGNOSIS — Z800 Family history of malignant neoplasm of digestive organs: Secondary | ICD-10-CM | POA: Diagnosis not present

## 2020-02-09 DIAGNOSIS — N400 Enlarged prostate without lower urinary tract symptoms: Secondary | ICD-10-CM | POA: Diagnosis not present

## 2020-02-09 DIAGNOSIS — D62 Acute posthemorrhagic anemia: Secondary | ICD-10-CM | POA: Diagnosis not present

## 2020-02-09 DIAGNOSIS — Z885 Allergy status to narcotic agent status: Secondary | ICD-10-CM

## 2020-02-09 DIAGNOSIS — E119 Type 2 diabetes mellitus without complications: Secondary | ICD-10-CM

## 2020-02-09 DIAGNOSIS — E785 Hyperlipidemia, unspecified: Secondary | ICD-10-CM | POA: Diagnosis present

## 2020-02-09 DIAGNOSIS — K5730 Diverticulosis of large intestine without perforation or abscess without bleeding: Secondary | ICD-10-CM | POA: Diagnosis not present

## 2020-02-09 DIAGNOSIS — I10 Essential (primary) hypertension: Secondary | ICD-10-CM | POA: Diagnosis present

## 2020-02-09 DIAGNOSIS — F329 Major depressive disorder, single episode, unspecified: Secondary | ICD-10-CM | POA: Diagnosis present

## 2020-02-09 LAB — CBC
HCT: 29 % — ABNORMAL LOW (ref 39.0–52.0)
Hemoglobin: 9.6 g/dL — ABNORMAL LOW (ref 13.0–17.0)
MCH: 29.9 pg (ref 26.0–34.0)
MCHC: 33.1 g/dL (ref 30.0–36.0)
MCV: 90.3 fL (ref 80.0–100.0)
Platelets: 247 10*3/uL (ref 150–400)
RBC: 3.21 MIL/uL — ABNORMAL LOW (ref 4.22–5.81)
RDW: 12.9 % (ref 11.5–15.5)
WBC: 6.1 10*3/uL (ref 4.0–10.5)
nRBC: 0 % (ref 0.0–0.2)

## 2020-02-09 LAB — COMPREHENSIVE METABOLIC PANEL
ALT: 19 U/L (ref 0–44)
AST: 18 U/L (ref 15–41)
Albumin: 3.2 g/dL — ABNORMAL LOW (ref 3.5–5.0)
Alkaline Phosphatase: 48 U/L (ref 38–126)
Anion gap: 7 (ref 5–15)
BUN: 14 mg/dL (ref 8–23)
CO2: 26 mmol/L (ref 22–32)
Calcium: 8.5 mg/dL — ABNORMAL LOW (ref 8.9–10.3)
Chloride: 103 mmol/L (ref 98–111)
Creatinine, Ser: 1.14 mg/dL (ref 0.61–1.24)
GFR calc Af Amer: >60 mL/min (ref >60)
GFR calc non Af Amer: >60 mL/min (ref >60)
Glucose, Bld: 166 mg/dL — ABNORMAL HIGH (ref 70–99)
Potassium: 4 mmol/L (ref 3.5–5.1)
Sodium: 136 mmol/L (ref 135–145)
Total Bilirubin: 0.7 mg/dL (ref 0.3–1.2)
Total Protein: 5.6 g/dL — ABNORMAL LOW (ref 6.5–8.1)

## 2020-02-09 LAB — GLUCOSE, CAPILLARY
Glucose-Capillary: 174 mg/dL — ABNORMAL HIGH (ref 70–99)
Glucose-Capillary: 75 mg/dL (ref 70–99)
Glucose-Capillary: 187 mg/dL — ABNORMAL HIGH (ref 70–99)
Glucose-Capillary: 102 mg/dL — ABNORMAL HIGH (ref 70–99)

## 2020-02-09 LAB — HEMOGLOBIN A1C
Hgb A1c MFr Bld: 7.7 % — ABNORMAL HIGH (ref 4.8–5.6)
Mean Plasma Glucose: 174.29 mg/dL

## 2020-02-09 LAB — PROTIME-INR
INR: 1.1 (ref 0.8–1.2)
Prothrombin Time: 13.8 seconds (ref 11.4–15.2)

## 2020-02-09 LAB — MRSA PCR SCREENING: MRSA by PCR: NEGATIVE

## 2020-02-09 MED ORDER — SODIUM CHLORIDE 0.9% FLUSH
3.0000 mL | Freq: Two times a day (BID) | INTRAVENOUS | Status: DC
  Administered 2020-02-09 – 2020-02-12 (×7): 3 mL via INTRAVENOUS

## 2020-02-09 MED ORDER — ATORVASTATIN CALCIUM 40 MG PO TABS
40.0000 mg | ORAL_TABLET | Freq: Every evening | ORAL | Status: DC
  Administered 2020-02-09 – 2020-02-13 (×5): 40 mg via ORAL
  Filled 2020-02-09 (×5): qty 1

## 2020-02-09 MED ORDER — INSULIN ASPART 100 UNIT/ML ~~LOC~~ SOLN
0.0000 [IU] | Freq: Three times a day (TID) | SUBCUTANEOUS | Status: DC
  Administered 2020-02-09: 3 [IU] via SUBCUTANEOUS
  Administered 2020-02-11 – 2020-02-13 (×4): 2 [IU] via SUBCUTANEOUS

## 2020-02-09 MED ORDER — ONDANSETRON HCL 4 MG/2ML IJ SOLN: 4.0000 mg | Freq: Four times a day (QID) | INTRAMUSCULAR | Status: DC | PRN

## 2020-02-09 MED ORDER — LACTATED RINGERS IV SOLN: INTRAVENOUS | Status: DC

## 2020-02-09 MED ORDER — ACETAMINOPHEN 650 MG RE SUPP: 650.0000 mg | Freq: Four times a day (QID) | RECTAL | Status: DC | PRN

## 2020-02-09 MED ORDER — ZOLPIDEM TARTRATE 5 MG PO TABS: 5.0000 mg | ORAL_TABLET | Freq: Every evening | ORAL | Status: DC | PRN

## 2020-02-09 MED ORDER — MORPHINE SULFATE (PF) 2 MG/ML IV SOLN: 2.0000 mg | INTRAVENOUS | Status: DC | PRN

## 2020-02-09 MED ORDER — HYDROCODONE-ACETAMINOPHEN 5-325 MG PO TABS: 1.0000 | ORAL_TABLET | ORAL | Status: DC | PRN

## 2020-02-09 MED ORDER — VENLAFAXINE HCL ER 37.5 MG PO CP24
37.5000 mg | ORAL_CAPSULE | Freq: Every day | ORAL | Status: DC
  Administered 2020-02-09 – 2020-02-12 (×4): 37.5 mg via ORAL
  Filled 2020-02-09 (×5): qty 1

## 2020-02-09 MED ORDER — TAMSULOSIN HCL 0.4 MG PO CAPS
0.4000 mg | ORAL_CAPSULE | Freq: Every day | ORAL | Status: DC
  Administered 2020-02-09 – 2020-02-12 (×4): 0.4 mg via ORAL
  Filled 2020-02-09 (×4): qty 1

## 2020-02-09 MED ORDER — ONDANSETRON HCL 4 MG PO TABS: 4.0000 mg | ORAL_TABLET | Freq: Four times a day (QID) | ORAL | Status: DC | PRN

## 2020-02-09 MED ORDER — ACETAMINOPHEN 325 MG PO TABS: 650.0000 mg | ORAL_TABLET | Freq: Four times a day (QID) | ORAL | Status: DC | PRN

## 2020-02-09 MED ORDER — IRBESARTAN 150 MG PO TABS
150.0000 mg | ORAL_TABLET | Freq: Every morning | ORAL | Status: DC
  Administered 2020-02-10 – 2020-02-13 (×4): 150 mg via ORAL
  Filled 2020-02-09 (×4): qty 1

## 2020-02-09 MED ORDER — LORATADINE 10 MG PO TABS
10.0000 mg | ORAL_TABLET | Freq: Every day | ORAL | Status: DC
  Administered 2020-02-10 – 2020-02-13 (×3): 10 mg via ORAL
  Filled 2020-02-09 (×4): qty 1

## 2020-02-09 MED ORDER — HYDROCHLOROTHIAZIDE 25 MG PO TABS
25.0000 mg | ORAL_TABLET | Freq: Every morning | ORAL | Status: DC
  Administered 2020-02-10 – 2020-02-13 (×4): 25 mg via ORAL
  Filled 2020-02-09 (×4): qty 1

## 2020-02-09 MED ORDER — VALSARTAN-HYDROCHLOROTHIAZIDE 160-25 MG PO TABS: 1.0000 | ORAL_TABLET | Freq: Every morning | ORAL | Status: DC

## 2020-02-09 MED ORDER — HYDRALAZINE HCL 20 MG/ML IJ SOLN: 5.0000 mg | INTRAMUSCULAR | Status: DC | PRN

## 2020-02-09 NOTE — H&P (Signed)
History and Physical    COADY TRAIN OXB:[id number] DOB: February 24, 1941 DOA: 02/09/2020  PCP: Richardean Chimera, MD Consultants:  None Patient coming from:  Home - lives with wife; NOK: Wife, [phone]  Chief Complaint: GI bleeding  HPI: Wayne Calhoun is a 79 y.o. male with medical history significant of HTN; HLD; DM: and BPH who presented to [facility] with GI bleeding.  He went to the bathroom on Wednesday and noticed red blood in the commode.  About 30 minutes later, it happened again with dark and red blood.  He did not have abdominal pain.  He had a total of about 10 episodes from 8AM to last night.  No h/o prior.  His last colonoscopy was in 2018 in [place].  He feels ok now but like "I might have to make a bowel movement after a while."  The day before he noticed feeling light-headed/dizzy when bending over.  Mild SOB the day before that.  No chest discomfort.    ED Course: [facility] to [facility] transfer, per Dr. Rachael Darby:  Presents with RLQ and epigastric abdominal pain since 8 am this am. Had dark maroon stool during am. No with bright red blood per rectum. Hx of PUD. No aspirin or anticoagulant use. had soft BP and was given one liter NS bolus. BP now 108/80. Is on IV protonix. Hgb 2 weeks ago was 14.  this am Hgb was 12.4 when arrived. Now 10.8 with continued bleeding.  No GI available at [facility].  Accepted to medical Telemetry bed.  Will need GI consult upon arrival.    Review of Systems: As per HPI; otherwise review of systems reviewed and negative.   Ambulatory Status:  Ambulates without assistance  COVID Vaccine Status:   Complete  Past Medical History:  Diagnosis Date  . BPH (benign prostatic hyperplasia)   . Diabetes mellitus without complication (HCC)   . Hypercholesteremia   . Hypertension     Past Surgical History:  Procedure Laterality Date  . HERNIA REPAIR    . SHOULDER SURGERY Right   . SHOULDER SURGERY Left     Social History   Socioeconomic History  .  Marital status: Married    Spouse name: Not on file  . Number of children: Not on file  . Years of education: Not on file  . Highest education level: Not on file  Occupational History  . Occupation: Pensions consultant  Tobacco Use  . Smoking status: Never Smoker  . Smokeless tobacco: Never Used  Vaping Use  . Vaping Use: Never used  Substance and Sexual Activity  . Alcohol use: No  . Drug use: No  . Sexual activity: Not on file  Other Topics Concern  . Not on file  Social History Narrative  . Not on file   Social Determinants of Health   Financial Resource Strain:   . Difficulty of Paying Living Expenses:   Food Insecurity:   . Worried About Programme researcher, broadcasting/film/video in the Last Year:   . Barista in the Last Year:   Transportation Needs:   . Freight forwarder (Medical):   Marland Kitchen Lack of Transportation (Non-Medical):   Physical Activity:   . Days of Exercise per Week:   . Minutes of Exercise per Session:   Stress:   . Feeling of Stress :   Social Connections:   . Frequency of Communication with Friends and Family:   . Frequency of Social Gatherings with Friends and Family:   .  Attends Religious Services:   . Active Member of Clubs or Organizations:   . Attends Banker Meetings:   Marland Kitchen Marital Status:   Intimate Partner Violence:   . Fear of Current or Ex-Partner:   . Emotionally Abused:   Marland Kitchen Physically Abused:   . Sexually Abused:     Allergies  Allergen Reactions  . Codeine Other (See Comments)    Patient unsure, told by doctor.     Family History  Problem Relation Age of Onset  . Colon cancer Brother 60    Prior to Admission medications   Medication Sig Start Date End Date Taking? Authorizing Provider  acetaminophen (TYLENOL) 500 MG tablet Take 500 mg by mouth every 6 (six) hours as needed for mild pain or moderate pain.    [provider]  alum & mag hydroxide-simeth (MAALOX/MYLANTA) 200-200-20 MG/5ML suspension Take 15 mLs by mouth  every 6 (six) hours as needed for indigestion or heartburn.    [provider]  aspirin EC 81 MG tablet Take 81 mg by mouth daily.    [provider]  cetirizine (ZYRTEC) 10 MG tablet Take 1 tablet by mouth daily. 12/31/17   [provider]  escitalopram (LEXAPRO) 10 MG tablet Take 10 mg by mouth daily.    [provider]  famotidine (PEPCID) 20 MG tablet Take 1 tablet (20 mg total) by mouth 2 (two) times daily. 04/17/17   Eber Hong, MD  Ginseng 250 MG CAPS Take 250 mg by mouth daily.    [provider]  glipiZIDE (GLUCOTROL XL) 5 MG 24 hr tablet Take 5 mg by mouth daily with breakfast.     [provider]  losartan-hydrochlorothiazide (HYZAAR) 100-25 MG per tablet Take 1 tablet by mouth daily.    [provider]  lovastatin (MEVACOR) 20 MG tablet Take 20 mg by mouth at bedtime.    [provider]  metFORMIN (GLUCOPHAGE-XR) 500 MG 24 hr tablet Take 2,000 mg by mouth daily.    [provider]  Multiple Vitamins-Minerals (MULTIVITAMIN GUMMIES MENS) CHEW Chew 2 Units by mouth daily.    [provider]  omeprazole (PRILOSEC) 20 MG capsule Take 20 mg by mouth daily.    [provider]  psyllium (METAMUCIL) 58.6 % packet Take 1 packet by mouth daily.    [provider]  tamsulosin (FLOMAX) 0.4 MG CAPS capsule Take 0.4 mg by mouth at bedtime.    [provider]  Tetrahydrozoline HCl (EYE DROPS OP) Apply 1 drop to eye daily as needed (FOR DRY EYE/IRRITATION).    [provider]    Physical Exam: Vitals:   02/09/20 2440 02/09/20 0741 02/09/20 1110 02/09/20 1646  BP: (!) 150/70 120/70 127/75 137/62  Pulse: 72 82 75 60  Resp: 14 18 12 20   Temp: 98.1 F (36.7 C) 97.7 F (36.5 C) 98.2 F (36.8 C) 98.1 F (36.7 C)  TempSrc: Oral Oral Oral Oral  SpO2: 97% 100% 99% 100%  Weight: 74.3 kg     Height: 5\' 5"  (1.651 m)        . General:  Appears calm and comfortable and is  NAD . Eyes:   EOMI, normal lids, iris . ENT:  grossly normal hearing, lips & tongue, mmm . Neck:  no LAD, masses or thyromegaly . Cardiovascular:  RRR, no m/r/g. No LE edema.  Respiratory:   CTA bilaterally with no wheezes/rales/rhonchi.  Normal respiratory effort. . Abdomen:  soft, NT, ND, NABS . Skin:  no rash or induration seen on limited exam . Musculoskeletal:  grossly normal tone BUE/BLE, good ROM, no bony abnormality . Psychiatric:  grossly normal mood and affect, speech fluent and appropriate, AOx3 . Neurologic:  CN 2-12 grossly intact, moves all extremities in coordinated fashion    Radiological Exams on Admission: No results found.   Labs on Admission: I have personally reviewed the available labs and imaging studies at the time of the admission.  Pertinent labs:   Glucose 166 Albumin 3.2 WBC 6.1 Hgb 9.6; baseline about 14 INR 1.1   Assessment/Plan Principal Problem:   Acute GI bleeding Active Problems:   Hypertension   Hypercholesteremia   Diabetes mellitus without complication (HCC)   BPH (benign prostatic hyperplasia)   Lower GI bleeding -Differential to include bleeding hemorrhoids; bacterial infectious colitis with likely pathogen Campylobacter jejuni;and AVM - but most likely etiology is diverticular bleeding -He is afebrile at this time and no leukocytosis; will not give antibiotics at this time.  -Bleeding lingered throughout the day yesterday but appears to have stopped now -Will admit on telemetry and continue to monitor for recurrent bleeding -CBC mildly decreasing but still stable; recheck in AM and transfuse for Hgb <7 -If significant bleeding, a tagged RBC scan could be considered to try to determine the most likely source of the bleeding  -GI consult has been requested -Hold ASA for now  HTN -Continue Valsartan-HCTZ  HLD -Continue Lipitor  DM -Will check A1c -hold Glucophage, Glucotrol -Cover with moderate-scale SSI  BPH -Continue  Flomax  Depression -Continue Effexor     DVT prophylaxis: SCDs Code Status:  Full - confirmed with patient Family Communication: None present; he declined to have me call his wife at the time of admission. Disposition Plan:  The patient is from: home  Anticipated d/c is to: home without [facility] services   Anticipated d/c date will depend on clinical response to treatment, but possibly as early as tomorrow if he has excellent response to treatment  Patient is currently: acutely ill Consults called: GI Admission status:  Admit - It is my clinical opinion that admission to INPATIENT is reasonable and necessary because of the expectation that this patient will require hospital care that crosses at least 2 midnights to treat this condition based on the medical complexity of the problems presented.  Given the aforementioned information, the predictability of an adverse outcome is felt to be significant.    Jonah Blue MD [facility]   How to contact the [facility] Attending or Consulting provider 7A - 7P or covering provider during after hours 7P -7A, for this patient?  1. Check the care team in [facility] and look for a) attending/consulting [facility] provider listed and b) the [facility] team listed 2. Log into [email or web address] and use [facility]'s universal password to access. If you do not have the password, please contact the hospital operator. 3. Locate the [facility] provider you are looking for under [facility] and page to a number that you can be directly reached. 4. If you still have difficulty reaching the provider, please page the [facility] (Director on Call) for the Hospitalists listed on amion for assistance.   02/09/2020, 7:07 PM

## 2020-02-09 NOTE — Progress Notes (Signed)
Received pt via carelink to 2c10.  Verified identity and date of birth with pt.  Pt on Protonix gtt at 8 cc/hr.  Placed on monitor and vs taken, stable at this time.  Pt alert and oriented x4, pt notified wife of arrival to [facility] and room number.  [facility] notified of pt arrival and room number.  VSS, pt denies any pain or dizziness at this time.  Will continue to monitor.

## 2020-02-09 NOTE — Consult Note (Signed)
Referring Provider: Dr. Jonah Blue Primary Care Physician:  Richardean Chimera, MD Primary Gastroenterologist:  Gentry Fitz  Reason for Consultation:  Rectal bleeding  HPI: Wayne Calhoun is a 79 y.o. male with history of diverticulosis and GERD presenting for consultation of rectal bleeding.  Patient was in his usual state of health until yesterday when he started experiencing rectal bleeding.  He had several episodes yesterday, as well as one episode this morning.  He describes the bleeding as bright red mixed with some black clots.  Stool does not appear to be black, however.  Denies any abdominal pain.  Typically has 1 formed bowel movement daily.  Occasionally has constipation for which she takes MiraLAX.  Denies any diarrhea.  He has a history of GERD for which he takes omeprazole.  Denies any dysphagia or changes in appetite.  States he has lost about 5 pounds in about a month's time.  He takes 81 mg aspirin daily but denies any NSAID or anticoagulation use.  Family history pertinent for brother with colon cancer at age 8.  Last colonoscopy was in 2018 and was pertinent for diverticulosis but otherwise normal per patient report.  CT of the abdomen 02/08/20: No acute intra-abdominal or pelvic pathology. No bowel  obstruction. Colonic and small bowel diverticulosis.   Past Medical History:  Diagnosis Date  . BPH (benign prostatic hyperplasia)   . Diabetes mellitus without complication (HCC)   . Hypercholesteremia   . Hypertension     Past Surgical History:  Procedure Laterality Date  . HERNIA REPAIR    . SHOULDER SURGERY Right   . SHOULDER SURGERY Left     Prior to Admission medications   Medication Sig Start Date End Date Taking? Authorizing Provider  acetaminophen (TYLENOL) 500 MG tablet Take 500 mg by mouth every 6 (six) hours as needed for mild pain or moderate pain.    [provider]  alum & mag hydroxide-simeth (MAALOX/MYLANTA) 200-200-20 MG/5ML suspension  Take 15 mLs by mouth every 6 (six) hours as needed for indigestion or heartburn.    [provider]  aspirin EC 81 MG tablet Take 81 mg by mouth daily.    [provider]  cetirizine (ZYRTEC) 10 MG tablet Take 1 tablet by mouth daily. 12/31/17   [provider]  escitalopram (LEXAPRO) 10 MG tablet Take 10 mg by mouth daily.    [provider]  famotidine (PEPCID) 20 MG tablet Take 1 tablet (20 mg total) by mouth 2 (two) times daily. 04/17/17   Eber Hong, MD  Ginseng 250 MG CAPS Take 250 mg by mouth daily.    [provider]  glipiZIDE (GLUCOTROL XL) 5 MG 24 hr tablet Take 5 mg by mouth daily with breakfast.     [provider]  losartan-hydrochlorothiazide (HYZAAR) 100-25 MG per tablet Take 1 tablet by mouth daily.    [provider]  lovastatin (MEVACOR) 20 MG tablet Take 20 mg by mouth at bedtime.    [provider]  metFORMIN (GLUCOPHAGE-XR) 500 MG 24 hr tablet Take 2,000 mg by mouth daily.    [provider]  Multiple Vitamins-Minerals (MULTIVITAMIN GUMMIES MENS) CHEW Chew 2 Units by mouth daily.    [provider]  omeprazole (PRILOSEC) 20 MG capsule Take 20 mg by mouth daily.    [provider]  psyllium (METAMUCIL) 58.6 % packet Take 1 packet by mouth daily.    [provider]  tamsulosin (FLOMAX) 0.4 MG CAPS capsule Take 0.4 mg  by mouth at bedtime.    [provider]  Tetrahydrozoline HCl (EYE DROPS OP) Apply 1 drop to eye daily as needed (FOR DRY EYE/IRRITATION).    [provider]    Scheduled Meds: . insulin aspart  0-15 Units Subcutaneous TID WC  . sodium chloride flush  3 mL Intravenous Q12H   Continuous Infusions: . lactated ringers     PRN Meds:.acetaminophen **OR** acetaminophen, hydrALAZINE, HYDROcodone-acetaminophen, morphine injection, ondansetron **OR** ondansetron (ZOFRAN) IV, zolpidem  Allergies as of 02/08/2020 - Review Complete 07/31/2018   Allergen Reaction Noted  . Codeine Other (See Comments) 01/03/2018    Family History  Problem Relation Age of Onset  . Colon cancer Brother 64    Social History   Socioeconomic History  . Marital status: Married    Spouse name: Not on file  . Number of children: Not on file  . Years of education: Not on file  . Highest education level: Not on file  Occupational History  . Occupation: Pensions consultant  Tobacco Use  . Smoking status: Never Smoker  . Smokeless tobacco: Never Used  Vaping Use  . Vaping Use: Never used  Substance and Sexual Activity  . Alcohol use: No  . Drug use: No  . Sexual activity: Not on file  Other Topics Concern  . Not on file  Social History Narrative  . Not on file   Social Determinants of Health   Financial Resource Strain:   . Difficulty of Paying Living Expenses:   Food Insecurity:   . Worried About Programme researcher, broadcasting/film/video in the Last Year:   . Barista in the Last Year:   Transportation Needs:   . Freight forwarder (Medical):   Marland Kitchen Lack of Transportation (Non-Medical):   Physical Activity:   . Days of Exercise per Week:   . Minutes of Exercise per Session:   Stress:   . Feeling of Stress :   Social Connections:   . Frequency of Communication with Friends and Family:   . Frequency of Social Gatherings with Friends and Family:   . Attends Religious Services:   . Active Member of Clubs or Organizations:   . Attends Banker Meetings:   Marland Kitchen Marital Status:   Intimate Partner Violence:   . Fear of Current or Ex-Partner:   . Emotionally Abused:   Marland Kitchen Physically Abused:   . Sexually Abused:     Review of Systems: Review of Systems  Constitutional: Negative for chills and fever.  HENT: Negative for hearing loss and tinnitus.   Eyes: Negative for pain and redness.  Respiratory: Negative for cough and shortness of breath.   Cardiovascular: Negative for chest pain and palpitations.  Gastrointestinal: Positive for blood  in stool and constipation. Negative for abdominal pain, diarrhea, heartburn, melena, nausea and vomiting.  Genitourinary: Negative for flank pain and hematuria.  Musculoskeletal: Negative for falls and joint pain.  Skin: Negative for itching and rash.  Neurological: Negative for seizures and loss of consciousness.  Endo/Heme/Allergies: Negative for polydipsia. Does not bruise/bleed easily.  Psychiatric/Behavioral: Negative for substance abuse. The patient is not nervous/anxious.     Physical Exam: Vital signs: Vitals:   02/09/20 0626 02/09/20 0741  BP: (!) 150/70 120/70  Pulse: 72 82  Resp: 14 18  Temp: 98.1 F (36.7 C) 97.7 F (36.5 C)  SpO2: 97% 100%   Last BM Date: 02/08/20  Physical Exam Constitutional:      General: He is not in acute  distress.    Appearance: Normal appearance.  HENT:     Head: Normocephalic and atraumatic.     Nose: Nose normal.     Mouth/Throat:     Mouth: Mucous membranes are moist.     Pharynx: Oropharynx is clear.  Eyes:     General: No scleral icterus.    Extraocular Movements: Extraocular movements intact.     Conjunctiva/sclera: Conjunctivae normal.  Cardiovascular:     Rate and Rhythm: Normal rate.     Pulses: Normal pulses.     Heart sounds: Normal heart sounds.  Pulmonary:     Effort: Pulmonary effort is normal.     Breath sounds: Normal breath sounds.  Abdominal:     General: Bowel sounds are normal. There is no distension.     Palpations: Abdomen is soft. There is no mass.     Tenderness: There is no abdominal tenderness. There is no guarding or rebound.     Hernia: No hernia is present.  Musculoskeletal:        General: No swelling or tenderness.     Cervical back: Normal range of motion and neck supple.  Skin:    General: Skin is warm and dry.  Neurological:     General: No focal deficit present.     Mental Status: He is alert and oriented to person, place, and time.  Psychiatric:        Mood and Affect: Mood normal.         Behavior: Behavior normal.    GI:  Lab Results: No results for input(s): WBC, HGB, HCT, PLT in the last 72 hours. BMET No results for input(s): NA, K, CL, CO2, GLUCOSE, BUN, CREATININE, CALCIUM in the last 72 hours. LFT No results for input(s): PROT, ALBUMIN, AST, ALT, ALKPHOS, BILITOT, BILIDIR, IBILI in the last 72 hours. PT/INR No results for input(s): LABPROT, INR in the last 72 hours.   Studies/Results: No results found.  Impression: Painless hematochezia, most consistent with diverticular bleeding. -Hgb 10.8 yesterday, decreased from Hgb 13-14 -Hemodynamically stable -CT pertinent only for diverticulosis  Plan: Supportive care with IV fluids.  Continue to monitor clinical status.  Continue to monitor H&H with transfusion as needed to maintain hemoglobin greater than 7.  If destabilizing bleeding occurs, consider CTA versus nuclear bleeding scan followed by IR consultation of positive.  [facility] will follow.   LOS: 0 days   Edrick Kins  PA-C 02/09/2020, 9:59 AM  Contact #  [phone]

## 2020-02-10 LAB — CBC
HCT: 26.6 % — ABNORMAL LOW (ref 39.0–52.0)
Hemoglobin: 8.6 g/dL — ABNORMAL LOW (ref 13.0–17.0)
MCH: 29.1 pg (ref 26.0–34.0)
MCHC: 32.3 g/dL (ref 30.0–36.0)
MCV: 89.9 fL (ref 80.0–100.0)
Platelets: 225 10*3/uL (ref 150–400)
RBC: 2.96 MIL/uL — ABNORMAL LOW (ref 4.22–5.81)
RDW: 12.8 % (ref 11.5–15.5)
WBC: 5.5 10*3/uL (ref 4.0–10.5)
nRBC: 0 % (ref 0.0–0.2)

## 2020-02-10 LAB — BASIC METABOLIC PANEL
Anion gap: 9 (ref 5–15)
BUN: 10 mg/dL (ref 8–23)
CO2: 26 mmol/L (ref 22–32)
Calcium: 8.6 mg/dL — ABNORMAL LOW (ref 8.9–10.3)
Chloride: 106 mmol/L (ref 98–111)
Creatinine, Ser: 1.18 mg/dL (ref 0.61–1.24)
GFR calc Af Amer: >60 mL/min (ref >60)
GFR calc non Af Amer: 59 mL/min — ABNORMAL LOW (ref >60)
Glucose, Bld: 116 mg/dL — ABNORMAL HIGH (ref 70–99)
Potassium: 3.8 mmol/L (ref 3.5–5.1)
Sodium: 141 mmol/L (ref 135–145)

## 2020-02-10 LAB — HEMOGLOBIN AND HEMATOCRIT, BLOOD
HCT: 28.4 % — ABNORMAL LOW (ref 39.0–52.0)
Hemoglobin: 9.4 g/dL — ABNORMAL LOW (ref 13.0–17.0)

## 2020-02-10 LAB — GLUCOSE, CAPILLARY
Glucose-Capillary: 121 mg/dL — ABNORMAL HIGH (ref 70–99)
Glucose-Capillary: 105 mg/dL — ABNORMAL HIGH (ref 70–99)
Glucose-Capillary: 117 mg/dL — ABNORMAL HIGH (ref 70–99)
Glucose-Capillary: 115 mg/dL — ABNORMAL HIGH (ref 70–99)

## 2020-02-10 NOTE — Plan of Care (Signed)
  Problem: Nutrition: Goal: Adequate nutrition will be maintained Outcome: Progressing   

## 2020-02-10 NOTE — Progress Notes (Signed)
[facility] Progress Note  Wayne Calhoun 79 y.o. 06/30/1941  CC:  Suspected lower GI bleeding  Subjective: Patient reports feeling well.  States he had 2 bowel movements yesterday and one this morning.  States amount of bleeding is decreasing. Denies any abdominal pain.  ROS : Review of Systems  Respiratory: Negative for shortness of breath.   Cardiovascular: Negative for chest pain and palpitations.  Gastrointestinal: Positive for blood in stool. Negative for abdominal pain, constipation, diarrhea, heartburn, melena, nausea and vomiting.  Neurological: Negative for dizziness.   Objective: Vital signs in last 24 hours: Vitals:   02/10/20 0619 02/10/20 0800  BP:  (!) 144/72  Pulse: 73   Resp: (!) 21   Temp:  98.4 F (36.9 C)  SpO2: 96%     Physical Exam:  General:  Alert, cooperative, no acute distress  Head:  Normocephalic, without obvious abnormality, atraumatic  Eyes:  Mild conjunctival pallor, EOM's intact,   Lungs:   Clear to auscultation bilaterally, respirations unlabored  Heart:  Regular rate and rhythm, S1, S2 normal  Abdomen:   Soft, non-tender, non-distended, bowel sounds active all four quadrants,  no guarding or peritoneal signs  Extremities: Extremities normal, atraumatic, no  edema  Pulses: 2+ and symmetric    Lab Results: Recent Labs    02/09/20 1130 02/10/20 0239  NA 136 141  K 4.0 3.8  CL 103 106  CO2 26 26  GLUCOSE 166* 116*  BUN 14 10  CREATININE 1.14 1.18  CALCIUM 8.5* 8.6*   Recent Labs    02/09/20 1130  AST 18  ALT 19  ALKPHOS 48  BILITOT 0.7  PROT 5.6*  ALBUMIN 3.2*   Recent Labs    02/09/20 1130 02/10/20 0239  WBC 6.1 5.5  HGB 9.6* 8.6*  HCT 29.0* 26.6*  MCV 90.3 89.9  PLT 247 225   Recent Labs    02/09/20 1130  LABPROT 13.8  INR 1.1   Impression: Painless hematochezia, most consistent with diverticular bleeding. -Hgb 8.6 today, decreased from 9.6 yesterday -Hemodynamically stable (nomotensive to  hypertensive with normal HR), and per patient, bleeding is decreasing -CT pertinent only for diverticulosis -BUN 10/Cr 1.18, not consistent with upper GI bleeding, despite flow sheet noting dark stools.  Stools yesterday were brown with bright red blood.  Plan: Supportive care with IV fluids.  Continue to monitor clinical status.  If continued bleeding or continued drop in Hgb, consider CTA versus nuclear bleeding scan followed by IR consultation i positive.  Continue to monitor H&H with transfusion as needed to maintain hemoglobin greater than 7.  [facility] will follow.  Edrick Kins PA-C 02/10/2020, 11:45 AM  Contact #  [phone]

## 2020-02-10 NOTE — Progress Notes (Addendum)
Patient heart rate noted to be in the 120s. Resp 30. Alert standing using urinal on bedside. When patient sat down heart rate came back down to 70s. No distress noted. Denies pain.VSS.  Blood sugar this am 121- per order to receive 1 unit of insulin with meal. Patient refuses at this time. No breakfast has arrived yet.

## 2020-02-10 NOTE — Progress Notes (Signed)
PROGRESS NOTE    [name]  OVF:[id number] DOB: 1941/03/19 DOA: 02/09/2020 PCP: Richardean Chimera, MD  Outpatient Specialists:   Brief Narrative:  As per H&P done on admission "HPI: Wayne Calhoun is a 79 y.o. male with medical history significant of HTN; HLD; DM: and BPH who presented to [facility] with GI bleeding.  He went to the bathroom on Wednesday and noticed red blood in the commode.  About 30 minutes later, it happened again with dark and red blood.  He did not have abdominal pain.  He had a total of about 10 episodes from 8AM to last night.  No h/o prior.  His last colonoscopy was in 2018 in [place].  He feels ok now but like "I might have to make a bowel movement after a while."  The day before he noticed feeling light-headed/dizzy when bending over.  Mild SOB the day before that.  No chest discomfort.    ED Course: [facility] to [facility] transfer, per Dr. Rachael Darby:  Presents with RLQ and epigastric abdominal pain since 8 am this am. Had dark maroon stool during am. No with bright red blood per rectum. Hx of PUD. No aspirin or anticoagulant use. had soft BP and was given one liter NS bolus. BP now 108/80. Is on IV protonix. Hgb 2 weeks ago was 14.  this am Hgb was 12.4 when arrived. Now 10.8 with continued bleeding.  No GI available at [facility].  Accepted to medical Telemetry bed.  Will need GI consult upon arrival".  02/10/2020: Patient seen alongside patient's wife.  Available records reviewed.  Rectal bleeding seems to be stabilizing.  We will continue to monitor H/H.  History of intermittent constipation endorsed.  GI input is highly appreciated.  Assessment & Plan:   Principal Problem:   Acute GI bleeding Active Problems:   Hypertension   Hypercholesteremia   Diabetes mellitus without complication (HCC)   BPH (benign prostatic hyperplasia)   Rectal bleeding: -Continue to monitor closely.   -Worrisome for possible diverticular bleed.   -H/H every 8 hourly.  Hemoglobin has  remained relatively stable. -Aspirin is on hold.   -If significant bleeding, a tagged RBC scan could be considered to try to determine the most likely source of the bleeding  -Further management depend on hospital course.HTN -Continue Valsartan-HCTZ  HLD -Continue Lipitor 40 mg p.o. once daily (every evening)  DM -Continue to optimize. -Hemoglobin A1c of 7.7%.   -Continue sliding scale insulin coverage.   -Glucophage and Glucotrol on hold.  BPH -Continue Flomax  Depression -Continue Effexor   DVT prophylaxis: SCD Code Status: Full code Family Communication: Wife Disposition Plan: Likely discharge back home in the next 48 hours if patient remains stable.   Consultants:   GI  Procedures:   None  Antimicrobials:   None   Subjective: -No significant rectal bleeding reported. -No shortness of breath. -No chest pain. -No abdominal pain.  Objective: Vitals:   02/10/20 0600 02/10/20 0619 02/10/20 0800 02/10/20 1345  BP:   (!) 144/72 138/70  Pulse:  73  66  Resp: (!) 30 (!) 21  13  Temp:   98.4 F (36.9 C) 98.4 F (36.9 C)  TempSrc:   Oral Oral  SpO2: 97% 96%  97%  Weight:      Height:        Intake/Output Summary (Last 24 hours) at 02/10/2020 1643 Last data filed at 02/10/2020 1600 Gross per 24 hour  Intake 1453.26 ml  Output 1950 ml  Net -496.74 ml   Filed Weights   02/09/20 0626  Weight: 74.3 kg    Examination:  General exam: Appears calm and comfortable  Respiratory system: Clear to auscultation. Cardiovascular system: S1 & S2 heard Gastrointestinal system: Abdomen is nondistended, soft and nontender. No organomegaly or masses felt. Normal bowel sounds heard. Central nervous system: Alert and oriented. No focal neurological deficits. Extremities: No leg edema.    Data Reviewed: I have personally reviewed following labs and imaging studies  CBC: Recent Labs  Lab 02/09/20 1130 02/10/20 0239  WBC 6.1 5.5  HGB 9.6* 8.6*  HCT 29.0*  26.6*  MCV 90.3 89.9  PLT 247 225   Basic Metabolic Panel: Recent Labs  Lab 02/09/20 1130 02/10/20 0239  NA 136 141  K 4.0 3.8  CL 103 106  CO2 26 26  GLUCOSE 166* 116*  BUN 14 10  CREATININE 1.14 1.18  CALCIUM 8.5* 8.6*   GFR: Estimated Creatinine Clearance: 49.4 mL/min (by C-G formula based on SCr of 1.18 mg/dL). Liver Function Tests: Recent Labs  Lab 02/09/20 1130  AST 18  ALT 19  ALKPHOS 48  BILITOT 0.7  PROT 5.6*  ALBUMIN 3.2*   No results for input(s): LIPASE, AMYLASE in the last 168 hours. No results for input(s): AMMONIA in the last 168 hours. Coagulation Profile: Recent Labs  Lab 02/09/20 1130  INR 1.1   Cardiac Enzymes: No results for input(s): CKTOTAL, CKMB, CKMBINDEX, TROPONINI in the last 168 hours. BNP (last 3 results) No results for input(s): PROBNP in the last 8760 hours. HbA1C: Recent Labs    02/09/20 1130  HGBA1C 7.7*   CBG: Recent Labs  Lab 02/09/20 1644 02/09/20 1817 02/09/20 2113 02/10/20 0613 02/10/20 1123  GLUCAP 75 187* 102* 121* 105*   Lipid Profile: No results for input(s): CHOL, HDL, LDLCALC, TRIG, CHOLHDL, LDLDIRECT in the last 72 hours. Thyroid Function Tests: No results for input(s): TSH, T4TOTAL, FREET4, T3FREE, THYROIDAB in the last 72 hours. Anemia Panel: No results for input(s): VITAMINB12, FOLATE, FERRITIN, TIBC, IRON, RETICCTPCT in the last 72 hours. Urine analysis:    Component Value Date/Time   COLORURINE STRAW (A) 07/31/2018 1722   APPEARANCEUR CLEAR 07/31/2018 1722   LABSPEC 1.005 07/31/2018 1722   PHURINE 6.0 07/31/2018 1722   GLUCOSEU NEGATIVE 07/31/2018 1722   HGBUR NEGATIVE 07/31/2018 1722   BILIRUBINUR NEGATIVE 07/31/2018 1722   KETONESUR NEGATIVE 07/31/2018 1722   PROTEINUR NEGATIVE 07/31/2018 1722   NITRITE NEGATIVE 07/31/2018 1722   LEUKOCYTESUR NEGATIVE 07/31/2018 1722   Sepsis Labs: @LABRCNTIP (procalcitonin:4,lacticidven:4)  ) Recent Results (from the past 240 hour(s))  MRSA PCR  Screening     Status: None   Collection Time: 02/09/20  6:31 AM   Specimen: Nasopharyngeal  Result Value Ref Range Status   MRSA by PCR NEGATIVE NEGATIVE Final    Comment:        The GeneXpert MRSA Assay (FDA approved for NASAL specimens only), is one component of a comprehensive MRSA colonization surveillance program. It is not intended to diagnose MRSA infection nor to guide or monitor treatment for MRSA infections. Performed at [facility], [address], [place], [place]          Radiology Studies: No results found.      Scheduled Meds: . atorvastatin  40 mg Oral QPM  . irbesartan  150 mg Oral q AM   And  . hydrochlorothiazide  25 mg Oral q AM  . insulin aspart  0-15 Units Subcutaneous  TID WC  . loratadine  10 mg Oral Daily  . sodium chloride flush  3 mL Intravenous Q12H  . tamsulosin  0.4 mg Oral QHS  . venlafaxine XR  37.5 mg Oral QHS   Continuous Infusions: . lactated ringers 75 mL/hr at 02/09/20 2350     LOS: 1 day    Time spent: 35 minutes.    Berton Mount, MD  [facility] Pager #: [phone] 7PM-7AM contact night coverage as above

## 2020-02-11 LAB — GLUCOSE, CAPILLARY
Glucose-Capillary: 121 mg/dL — ABNORMAL HIGH (ref 70–99)
Glucose-Capillary: 117 mg/dL — ABNORMAL HIGH (ref 70–99)
Glucose-Capillary: 99 mg/dL (ref 70–99)
Glucose-Capillary: 118 mg/dL — ABNORMAL HIGH (ref 70–99)

## 2020-02-11 LAB — HEMOGLOBIN AND HEMATOCRIT, BLOOD
HCT: 26 % — ABNORMAL LOW (ref 39.0–52.0)
HCT: 27.4 % — ABNORMAL LOW (ref 39.0–52.0)
Hemoglobin: 8.8 g/dL — ABNORMAL LOW (ref 13.0–17.0)
Hemoglobin: 8.7 g/dL — ABNORMAL LOW (ref 13.0–17.0)

## 2020-02-11 MED ORDER — LACTATED RINGERS IV SOLN: INTRAVENOUS | Status: DC

## 2020-02-11 NOTE — Progress Notes (Signed)
PROGRESS NOTE    Wayne Calhoun  QBH:[id number] DOB: 1941-06-13 DOA: 02/09/2020 PCP: Richardean Chimera, MD  Outpatient Specialists:   Brief Narrative:  As per H&P done on admission "HPI: Wayne Calhoun is a 79 y.o. male with medical history significant of HTN; HLD; DM: and BPH who presented to [facility] with GI bleeding.  He went to the bathroom on Wednesday and noticed red blood in the commode.  About 30 minutes later, it happened again with dark and red blood.  He did not have abdominal pain.  He had a total of about 10 episodes from 8AM to last night.  No h/o prior.  His last colonoscopy was in 2018 in [place].  He feels ok now but like "I might have to make a bowel movement after a while."  The day before he noticed feeling light-headed/dizzy when bending over.  Mild SOB the day before that.  No chest discomfort.   ED Course: [facility] to [facility] transfer, per Dr. Rachael Darby:  Presents with RLQ and epigastric abdominal pain since 8 am this am. Had dark maroon stool during am. No with bright red blood per rectum. Hx of PUD. No aspirin or anticoagulant use. had soft BP and was given one liter NS bolus. BP now 108/80. Is on IV protonix. Hgb 2 weeks ago was 14.  this am Hgb was 12.4 when arrived. Now 10.8 with continued bleeding.  No GI available at [facility].  Accepted to medical Telemetry bed.  Will need GI consult upon arrival".  02/10/2020: Patient seen alongside patient's wife.  Available records reviewed.  Rectal bleeding seems to be stabilizing.  We will continue to monitor H/H.  History of intermittent constipation endorsed.  GI input is highly appreciated.  02/11/2020: Patient seen.  Patient has not had any bowel involvement today.  No further bleeding reported.  GI input is appreciated.  H&H has remained stable.  We will continue to monitor patient for now.  Assessment & Plan:   Principal Problem:   Acute GI bleeding Active Problems:   Hypertension   Hypercholesteremia   Diabetes mellitus  without complication (HCC)   BPH (benign prostatic hyperplasia)   Rectal bleeding: -Continue to monitor closely.   -Worrisome for possible diverticular bleed.   -H/H every 8 hourly.  Hemoglobin has remained relatively stable. -Aspirin is on hold.   -If significant bleeding, a tagged RBC scan could be considered to try to determine the most likely source of the bleeding  02/11/2020: No further bleeding noted.  H&H has remained stable.  GI input is highly appreciated.  HTN -Continue Valsartan-HCTZ -Blood pressure is reasonably controlled.  HLD -Continue Lipitor 40 mg p.o. once daily (every evening)  DM -Continue to optimize. -Hemoglobin A1c of 7.7%.   -Continue sliding scale insulin coverage.   -Glucophage and Glucotrol on hold.  BPH -Continue Flomax  Depression -Continue Effexor   DVT prophylaxis: SCD Code Status: Full code Family Communication: Wife Disposition Plan: Likely discharge back home in the next 48 hours if patient remains stable.   Consultants:   GI  Procedures:   None  Antimicrobials:   None   Subjective: -No bleeding overnight. -No bowel movement overnight. -No shortness of breath. -No chest pain. -No abdominal pain.  Objective: Vitals:   02/11/20 0000 02/11/20 0333 02/11/20 0736 02/11/20 1054  BP: 125/67 118/63 (!) 144/60 132/67  Pulse: 67 71 73 60  Resp: 18 18 20 20   Temp:  98.1 F (36.7 C) 98 F (36.7 C) 98.2  F (36.8 C)  TempSrc:  Oral Oral Oral  SpO2: 99% 97% 99% 97%  Weight:      Height:        Intake/Output Summary (Last 24 hours) at 02/11/2020 1645 Last data filed at 02/11/2020 1500 Gross per 24 hour  Intake 2403.89 ml  Output 1200 ml  Net 1203.89 ml   Filed Weights   02/09/20 0626  Weight: 74.3 kg    Examination:  General exam: Appears calm and comfortable  Respiratory system: Clear to auscultation. Cardiovascular system: S1 & S2 heard Gastrointestinal system: Abdomen is nondistended, soft and nontender. No  organomegaly or masses felt. Normal bowel sounds heard. Central nervous system: Alert and oriented. No focal neurological deficits. Extremities: No leg edema.    Data Reviewed: I have personally reviewed following labs and imaging studies  CBC: Recent Labs  Lab 02/09/20 1130 02/10/20 0239 02/10/20 1658 02/11/20 0137 02/11/20 0837  WBC 6.1 5.5  --   --   --   HGB 9.6* 8.6* 9.4* 8.8* 8.7*  HCT 29.0* 26.6* 28.4* 26.0* 27.4*  MCV 90.3 89.9  --   --   --   PLT 247 225  --   --   --    Basic Metabolic Panel: Recent Labs  Lab 02/09/20 1130 02/10/20 0239  NA 136 141  K 4.0 3.8  CL 103 106  CO2 26 26  GLUCOSE 166* 116*  BUN 14 10  CREATININE 1.14 1.18  CALCIUM 8.5* 8.6*   GFR: Estimated Creatinine Clearance: 49.4 mL/min (by C-G formula based on SCr of 1.18 mg/dL). Liver Function Tests: Recent Labs  Lab 02/09/20 1130  AST 18  ALT 19  ALKPHOS 48  BILITOT 0.7  PROT 5.6*  ALBUMIN 3.2*   No results for input(s): LIPASE, AMYLASE in the last 168 hours. No results for input(s): AMMONIA in the last 168 hours. Coagulation Profile: Recent Labs  Lab 02/09/20 1130  INR 1.1   Cardiac Enzymes: No results for input(s): CKTOTAL, CKMB, CKMBINDEX, TROPONINI in the last 168 hours. BNP (last 3 results) No results for input(s): PROBNP in the last 8760 hours. HbA1C: Recent Labs    02/09/20 1130  HGBA1C 7.7*   CBG: Recent Labs  Lab 02/10/20 1648 02/10/20 2216 02/11/20 0605 02/11/20 1054 02/11/20 1616  GLUCAP 117* 115* 121* 117* 99   Lipid Profile: No results for input(s): CHOL, HDL, LDLCALC, TRIG, CHOLHDL, LDLDIRECT in the last 72 hours. Thyroid Function Tests: No results for input(s): TSH, T4TOTAL, FREET4, T3FREE, THYROIDAB in the last 72 hours. Anemia Panel: No results for input(s): VITAMINB12, FOLATE, FERRITIN, TIBC, IRON, RETICCTPCT in the last 72 hours. Urine analysis:    Component Value Date/Time   COLORURINE STRAW (A) 07/31/2018 1722   APPEARANCEUR CLEAR  07/31/2018 1722   LABSPEC 1.005 07/31/2018 1722   PHURINE 6.0 07/31/2018 1722   GLUCOSEU NEGATIVE 07/31/2018 1722   HGBUR NEGATIVE 07/31/2018 1722   BILIRUBINUR NEGATIVE 07/31/2018 1722   KETONESUR NEGATIVE 07/31/2018 1722   PROTEINUR NEGATIVE 07/31/2018 1722   NITRITE NEGATIVE 07/31/2018 1722   LEUKOCYTESUR NEGATIVE 07/31/2018 1722   Sepsis Labs: @LABRCNTIP (procalcitonin:4,lacticidven:4)  ) Recent Results (from the past 240 hour(s))  MRSA PCR Screening     Status: None   Collection Time: 02/09/20  6:31 AM   Specimen: Nasopharyngeal  Result Value Ref Range Status   MRSA by PCR NEGATIVE NEGATIVE Final    Comment:        The GeneXpert MRSA Assay (FDA approved for NASAL specimens only), is  one component of a comprehensive MRSA colonization surveillance program. It is not intended to diagnose MRSA infection nor to guide or monitor treatment for MRSA infections. Performed at [facility], [address] [place], [place] [address]          Radiology Studies: No results found.      Scheduled Meds: . atorvastatin  40 mg Oral QPM  . irbesartan  150 mg Oral q AM   And  . hydrochlorothiazide  25 mg Oral q AM  . insulin aspart  0-15 Units Subcutaneous TID WC  . loratadine  10 mg Oral Daily  . sodium chloride flush  3 mL Intravenous Q12H  . tamsulosin  0.4 mg Oral QHS  . venlafaxine XR  37.5 mg Oral QHS   Continuous Infusions: . lactated ringers       LOS: 2 days    Time spent: 25 minutes.    Berton Mount, MD  [facility] Pager #: [phone] 7PM-7AM contact night coverage as above

## 2020-02-11 NOTE — Progress Notes (Signed)
[facility] Progress Note  Subjective: No complaints today of active GI bleeding.  Objective: Vital signs in last 24 hours: Temp:  [98 F (36.7 C)-98.5 F (36.9 C)] 98.2 F (36.8 C) (08/07 1054) Pulse Rate:  [60-73] 60 (08/07 1054) Resp:  [13-20] 20 (08/07 1054) BP: (118-144)/(60-70) 132/67 (08/07 1054) SpO2:  [97 %-99 %] 97 % (08/07 1054) Weight change:    PE:  No distress  Abdomen nontender  Lab Results: Results for orders placed or performed during the hospital encounter of 02/09/20 (from the past 24 hour(s))  Glucose, capillary     Status: Abnormal   Collection Time: 02/10/20  4:48 PM  Result Value Ref Range   Glucose-Capillary 117 (H) 70 - 99 mg/dL   Comment 1 Notify RN    Comment 2 Document in Chart   Hemoglobin and hematocrit, blood     Status: Abnormal   Collection Time: 02/10/20  4:58 PM  Result Value Ref Range   Hemoglobin 9.4 (L) 13.0 - 17.0 g/dL   HCT 54.0 (L) 39 - 52 %  Glucose, capillary     Status: Abnormal   Collection Time: 02/10/20 10:16 PM  Result Value Ref Range   Glucose-Capillary 115 (H) 70 - 99 mg/dL  Hemoglobin and hematocrit, blood     Status: Abnormal   Collection Time: 02/11/20  1:37 AM  Result Value Ref Range   Hemoglobin 8.8 (L) 13.0 - 17.0 g/dL   HCT 08.6 (L) 39 - 52 %  Glucose, capillary     Status: Abnormal   Collection Time: 02/11/20  6:05 AM  Result Value Ref Range   Glucose-Capillary 121 (H) 70 - 99 mg/dL  Hemoglobin and hematocrit, blood     Status: Abnormal   Collection Time: 02/11/20  8:37 AM  Result Value Ref Range   Hemoglobin 8.7 (L) 13.0 - 17.0 g/dL   HCT 76.1 (L) 39 - 52 %  Glucose, capillary     Status: Abnormal   Collection Time: 02/11/20 10:54 AM  Result Value Ref Range   Glucose-Capillary 117 (H) 70 - 99 mg/dL    Studies/Results: No results found.    Assessment: GI bleed, suspect diverticular in origin  Plan:   Continue observation and medical management.  If further bleeding occurs which  is significant we would recommend proceeding with nuclear medicine GI bleeding scan or CT angio.    SAM F Kaylenn Civil 02/11/2020, 11:30 AM  Pager: [phone] If no answer or after 5 PM call [phone] Lab Results  Component Value Date   HGB 8.7 (L) 02/11/2020   HGB 8.8 (L) 02/11/2020   HGB 9.4 (L) 02/10/2020   HCT 27.4 (L) 02/11/2020   HCT 26.0 (L) 02/11/2020   HCT 28.4 (L) 02/10/2020   ALKPHOS 48 02/09/2020   ALKPHOS 71 07/31/2018   ALKPHOS 74 04/17/2017   AST 18 02/09/2020   AST 16 07/31/2018   AST 21 04/17/2017   ALT 19 02/09/2020   ALT 15 07/31/2018   ALT 24 04/17/2017

## 2020-02-12 LAB — GLUCOSE, CAPILLARY
Glucose-Capillary: 131 mg/dL — ABNORMAL HIGH (ref 70–99)
Glucose-Capillary: 120 mg/dL — ABNORMAL HIGH (ref 70–99)
Glucose-Capillary: 101 mg/dL — ABNORMAL HIGH (ref 70–99)
Glucose-Capillary: 134 mg/dL — ABNORMAL HIGH (ref 70–99)

## 2020-02-12 LAB — HEMOGLOBIN AND HEMATOCRIT, BLOOD
HCT: 27 % — ABNORMAL LOW (ref 39.0–52.0)
Hemoglobin: 8.7 g/dL — ABNORMAL LOW (ref 13.0–17.0)

## 2020-02-12 NOTE — Plan of Care (Signed)
°  Problem: Nutrition: Goal: Adequate nutrition will be maintained Outcome: Progressing   Problem: Education: Goal: Knowledge of General Education information will improve Description: Including pain rating scale, medication(s)/side effects and non-pharmacologic comfort measures Outcome: Progressing   Problem: Health Behavior/Discharge Planning: Goal: Ability to manage health-related needs will improve Outcome: Progressing   Problem: Clinical Measurements: Goal: Ability to maintain clinical measurements within normal limits will improve Outcome: Progressing Goal: Will remain free from infection Outcome: Progressing Goal: Diagnostic test results will improve Outcome: Progressing Goal: Respiratory complications will improve Outcome: Progressing Goal: Cardiovascular complication will be avoided Outcome: Progressing   Problem: Activity: Goal: Risk for activity intolerance will decrease Outcome: Progressing   Problem: Nutrition: Goal: Adequate nutrition will be maintained Outcome: Progressing   Problem: Coping: Goal: Level of anxiety will decrease Outcome: Progressing   Problem: Elimination: Goal: Will not experience complications related to bowel motility Outcome: Progressing Goal: Will not experience complications related to urinary retention Outcome: Progressing   Problem: Pain Managment: Goal: General experience of comfort will improve Outcome: Progressing   Problem: Safety: Goal: Ability to remain free from injury will improve Outcome: Progressing   Problem: Skin Integrity: Goal: Risk for impaired skin integrity will decrease Outcome: Progressing   Problem: Education: Goal: Ability to identify signs and symptoms of gastrointestinal bleeding will improve Outcome: Progressing   Problem: Bowel/Gastric: Goal: Will show no signs and symptoms of gastrointestinal bleeding Outcome: Progressing   Problem: Fluid Volume: Goal: Will show no signs and symptoms of  excessive bleeding Outcome: Progressing   Problem: Clinical Measurements: Goal: Complications related to the disease process, condition or treatment will be avoided or minimized Outcome: Progressing

## 2020-02-12 NOTE — Progress Notes (Signed)
[facility] Progress Note  Subjective: No report of active bleeding today.  Patient has no complaints.  Objective: Vital signs in last 24 hours: Temp:  [97.7 F (36.5 C)-98.5 F (36.9 C)] 97.7 F (36.5 C) (08/08 0712) Pulse Rate:  [60-74] 71 (08/08 0747) Resp:  [15-20] 20 (08/08 0747) BP: (111-134)/(58-71) 134/61 (08/08 0712) SpO2:  [96 %-100 %] 97 % (08/08 0747) Weight change:    PE:  No distress  Lab Results: Results for orders placed or performed during the hospital encounter of 02/09/20 (from the past 24 hour(s))  Glucose, capillary     Status: Abnormal   Collection Time: 02/11/20 10:54 AM  Result Value Ref Range   Glucose-Capillary 117 (H) 70 - 99 mg/dL  Glucose, capillary     Status: None   Collection Time: 02/11/20  4:16 PM  Result Value Ref Range   Glucose-Capillary 99 70 - 99 mg/dL  Glucose, capillary     Status: Abnormal   Collection Time: 02/11/20  9:21 PM  Result Value Ref Range   Glucose-Capillary 118 (H) 70 - 99 mg/dL  Glucose, capillary     Status: Abnormal   Collection Time: 02/12/20  6:44 AM  Result Value Ref Range   Glucose-Capillary 131 (H) 70 - 99 mg/dL    Studies/Results: No results found.    Assessment: Lower GI bleed, suspect diverticular origin  Plan:   Continue observation and supportive care    Wayne Calhoun 02/12/2020, 10:32 AM  Pager: [phone] If no answer or after 5 PM call [phone]

## 2020-02-12 NOTE — Progress Notes (Signed)
PROGRESS NOTE    Wayne Calhoun  NOM:[id number] DOB: 01/11/1941 DOA: 02/09/2020 PCP: Richardean Chimera, MD  Outpatient Specialists:   Brief Narrative:  Patient is a 79 year old male with past medical history significant for hypertension, hyperlipidemia, diabetes mellitus and BPH.  Patient initially presented to [facility] with GI bleeding.  He went to the bathroom and noticed red blood in the commode.  About 30 minutes later, it happened again with dark and red blood.  He did not have abdominal pain.  He had a total of about 10 episodes of rectal bleed per rectum.  No h/o prior.  His last colonoscopy was in 2018 in [place].  Patient has been managed conservatively for possible diverticular bleed.  GI team is assisting in directing patient's care.  H/H has been monitored, and has remained stable.  Patient has not had any rectal bleed in the last 24 hours.  Patient will be discharged once cleared for discharge by the GI team.  Assessment & Plan:   Principal Problem:   Acute GI bleeding Active Problems:   Hypertension   Hypercholesteremia   Diabetes mellitus without complication (HCC)   BPH (benign prostatic hyperplasia)   Rectal bleeding: -Continue to monitor closely.   -Worrisome for possible diverticular bleed.   -H/H every 12 hourly.  Hemoglobin has remained relatively stable. -Aspirin is on hold.   -If significant bleeding, a tagged RBC scan could be considered to try to determine the most likely source of the bleeding  -GI input is highly appreciated.  Patient will be discharged back home once cleared for discharge by the GI team.  No rectal bleed in the last 24 hours.    HTN -Continue Valsartan-HCTZ -Blood pressure is controlled.  HLD -Continue Lipitor 40 mg p.o. once daily (every evening)  DM -Continue to optimize. -Hemoglobin A1c of 7.7%.   -Continue sliding scale insulin coverage.   -Glucophage and Glucotrol on hold.  BPH -Continue Flomax  Depression -Continue  Effexor   DVT prophylaxis: SCD Code Status: Full code Family Communication: Wife Disposition Plan: Likely discharge back home in the next 48 hours if patient remains stable.   Consultants:   GI  Procedures:   None  Antimicrobials:   None   Subjective: -No bleeding overnight. -No shortness of breath. -No chest pain. -No abdominal pain.  Objective: Vitals:   02/12/20 0712 02/12/20 0747 02/12/20 1116 02/12/20 1117  BP: 134/61   (!) 124/55  Pulse: 72 71  68  Resp: 19 20  20   Temp: 97.7 F (36.5 C)  98.1 F (36.7 C)   TempSrc: Oral  Oral   SpO2: 99% 97%    Weight:      Height:        Intake/Output Summary (Last 24 hours) at 02/12/2020 1512 Last data filed at 02/12/2020 1300 Gross per 24 hour  Intake 1302.5 ml  Output 2050 ml  Net -747.5 ml   Filed Weights   02/09/20 0626  Weight: 74.3 kg    Examination:  General exam: Appears calm and comfortable  Respiratory system: Clear to auscultation. Cardiovascular system: S1 & S2 heard Gastrointestinal system: Abdomen is nondistended, soft and nontender. No organomegaly or masses felt. Normal bowel sounds heard. Central nervous system: Alert and oriented. No focal neurological deficits. Extremities: No leg edema.    Data Reviewed: I have personally reviewed following labs and imaging studies  CBC: Recent Labs  Lab 02/09/20 1130 02/09/20 1130 02/10/20 0239 02/10/20 1658 02/11/20 0137 02/11/20 0837 02/12/20 1250  WBC  6.1  --  5.5  --   --   --   --   HGB 9.6*   < > 8.6* 9.4* 8.8* 8.7* 8.7*  HCT 29.0*   < > 26.6* 28.4* 26.0* 27.4* 27.0*  MCV 90.3  --  89.9  --   --   --   --   PLT 247  --  225  --   --   --   --    < > = values in this interval not displayed.   Basic Metabolic Panel: Recent Labs  Lab 02/09/20 1130 02/10/20 0239  NA 136 141  K 4.0 3.8  CL 103 106  CO2 26 26  GLUCOSE 166* 116*  BUN 14 10  CREATININE 1.14 1.18  CALCIUM 8.5* 8.6*   GFR: Estimated Creatinine Clearance: 49.4  mL/min (by C-G formula based on SCr of 1.18 mg/dL). Liver Function Tests: Recent Labs  Lab 02/09/20 1130  AST 18  ALT 19  ALKPHOS 48  BILITOT 0.7  PROT 5.6*  ALBUMIN 3.2*   No results for input(s): LIPASE, AMYLASE in the last 168 hours. No results for input(s): AMMONIA in the last 168 hours. Coagulation Profile: Recent Labs  Lab 02/09/20 1130  INR 1.1   Cardiac Enzymes: No results for input(s): CKTOTAL, CKMB, CKMBINDEX, TROPONINI in the last 168 hours. BNP (last 3 results) No results for input(s): PROBNP in the last 8760 hours. HbA1C: No results for input(s): HGBA1C in the last 72 hours. CBG: Recent Labs  Lab 02/11/20 1054 02/11/20 1616 02/11/20 2121 02/12/20 0644 02/12/20 1114  GLUCAP 117* 99 118* 131* 120*   Lipid Profile: No results for input(s): CHOL, HDL, LDLCALC, TRIG, CHOLHDL, LDLDIRECT in the last 72 hours. Thyroid Function Tests: No results for input(s): TSH, T4TOTAL, FREET4, T3FREE, THYROIDAB in the last 72 hours. Anemia Panel: No results for input(s): VITAMINB12, FOLATE, FERRITIN, TIBC, IRON, RETICCTPCT in the last 72 hours. Urine analysis:    Component Value Date/Time   COLORURINE STRAW (A) 07/31/2018 1722   APPEARANCEUR CLEAR 07/31/2018 1722   LABSPEC 1.005 07/31/2018 1722   PHURINE 6.0 07/31/2018 1722   GLUCOSEU NEGATIVE 07/31/2018 1722   HGBUR NEGATIVE 07/31/2018 1722   BILIRUBINUR NEGATIVE 07/31/2018 1722   KETONESUR NEGATIVE 07/31/2018 1722   PROTEINUR NEGATIVE 07/31/2018 1722   NITRITE NEGATIVE 07/31/2018 1722   LEUKOCYTESUR NEGATIVE 07/31/2018 1722   Sepsis Labs: @LABRCNTIP (procalcitonin:4,lacticidven:4)  ) Recent Results (from the past 240 hour(s))  MRSA PCR Screening     Status: None   Collection Time: 02/09/20  6:31 AM   Specimen: Nasopharyngeal  Result Value Ref Range Status   MRSA by PCR NEGATIVE NEGATIVE Final    Comment:        The GeneXpert MRSA Assay (FDA approved for NASAL specimens only), is one component of  a comprehensive MRSA colonization surveillance program. It is not intended to diagnose MRSA infection nor to guide or monitor treatment for MRSA infections. Performed at [facility], [address], [place], [place]          Radiology Studies: No results found.      Scheduled Meds: . atorvastatin  40 mg Oral QPM  . irbesartan  150 mg Oral q AM   And  . hydrochlorothiazide  25 mg Oral q AM  . insulin aspart  0-15 Units Subcutaneous TID WC  . loratadine  10 mg Oral Daily  . sodium chloride flush  3 mL Intravenous Q12H  . tamsulosin  0.4  mg Oral QHS  . venlafaxine XR  37.5 mg Oral QHS   Continuous Infusions: . lactated ringers 50 mL/hr at 02/12/20 1116     LOS: 3 days    Time spent: 25 minutes.    Berton Mount, MD  [facility] Pager #: [phone] 7PM-7AM contact night coverage as above

## 2020-02-13 LAB — HEMOGLOBIN AND HEMATOCRIT, BLOOD
HCT: 28.1 % — ABNORMAL LOW (ref 39.0–52.0)
Hemoglobin: 9.2 g/dL — ABNORMAL LOW (ref 13.0–17.0)

## 2020-02-13 LAB — GLUCOSE, CAPILLARY
Glucose-Capillary: 130 mg/dL — ABNORMAL HIGH (ref 70–99)
Glucose-Capillary: 135 mg/dL — ABNORMAL HIGH (ref 70–99)
Glucose-Capillary: 102 mg/dL — ABNORMAL HIGH (ref 70–99)

## 2020-02-13 NOTE — Progress Notes (Signed)
Wayne Calhoun 9:15 AM  Subjective: Patient without any GI complaints and just a little black formed stool Case discussed with his wife as well his hospital computer chart reviewed case discussed with my partner Dr. Evette Cristal he is tolerating full liquids and has no other complaints  Objective: Vital signs stable afebrile no acute distress abdomen is soft nontender hemoglobin stable  Assessment: Presumed diverticular bleed in a patient on aspirin  Plan: Hold aspirin for some time since he has not had any heart or neurologic issues and we discussed Tylenol okay and only and can probably be moved out of the unit and go home soon and will advance to soft diet and please call us if we can be of any further assistance with this hospital stay and he can follow-up with his primary doctor in 1 to 2 weeks as an outpatient  [facility]  office [phone] After 5PM or if no answer call [phone]

## 2020-02-13 NOTE — Plan of Care (Signed)
  Problem: Nutrition: Goal: Adequate nutrition will be maintained Outcome: Progressing   Problem: Education: Goal: Knowledge of General Education information will improve Description: Including pain rating scale, medication(s)/side effects and non-pharmacologic comfort measures Outcome: Progressing   Problem: Clinical Measurements: Goal: Ability to maintain clinical measurements within normal limits will improve Outcome: Progressing Goal: Will remain free from infection Outcome: Progressing Goal: Diagnostic test results will improve Outcome: Progressing Goal: Respiratory complications will improve Outcome: Progressing Goal: Cardiovascular complication will be avoided Outcome: Progressing   Problem: Nutrition: Goal: Adequate nutrition will be maintained Outcome: Progressing   Problem: Coping: Goal: Level of anxiety will decrease Outcome: Progressing

## 2020-02-13 NOTE — Progress Notes (Addendum)
RN provided patient with verbal discharge instructions. Paper copy provided for patient. RN answered all questions.IV removed per order. Patient ate dinner and was able to tolerate. No nausea or vomiting. Pt states feels great.  VSS at discharge. Pt belongings sent with patient. NT d/c'd pt via wheelchair to [employer] entrance to private vehicle.

## 2020-02-13 NOTE — Care Management Important Message (Signed)
Important Message  Patient Details  Name: Wayne Calhoun MRN: [id number] Date of Birth: 1940/11/28   Medicare Important Message Given:  Yes  Patient left prior to IM delivery.  IM mailed to the patient address.         Tymon Nemetz 02/13/2020, 3:42 PM

## 2020-02-13 NOTE — Discharge Summary (Signed)
Physician Discharge Summary  Wayne NasutiBobby L Calhoun ZOX:[id number] DOB: 10/17/1940 DOA: 02/09/2020  PCP: [name], [name], MD  Admit date: 02/09/2020 Discharge date: 02/13/2020  Admitted From: Home Discharge disposition: Home   Code Status: Full Code  Diet Recommendation: Soft diet for next 2 to 3 days before advancing to cardiac/diabetic diet  Discharge Diagnosis:   Principal Problem:   Acute GI bleeding Active Problems:   Hypertension   Hypercholesteremia   Diabetes mellitus without complication (HCC)   BPH (benign prostatic hyperplasia)   History of Present Illness / Brief narrative:  Patient is a 79 year old male with past medical history significant for hypertension, hyperlipidemia, diabetes mellitus and BPH.  [facility].He went to the bathroom and noticed red blood in the commode. About 30 minutes later, it happened again with dark and red blood. He did not have abdominal pain. He had a total of about 10 episodes of rectal bleed per rectum.  No h/o prior. His last colonoscopy was in 2018 in [place].  He was [facility] further evaluation management. A consult was obtained.  Patient was constipated medicine for diverticular bleeding. Hemoglobin has gradually slowly stabilized. Diet advanced patient is for discharge to home today.  Hospital Course:  Rectal bleeding: -Likely diverticular bleed.  Improved.  Hemoglobin stabilized.   -Aspirin was held. -GI consultation appreciated.  Did not need intervention in this hospital stay.   -Able to tolerate soft diet.   -Since patient has not had any history of CVA, CAD.  He has been recommended to stop aspirin altogether. -Follow-up with GI as an outpatient as needed.  HTN -Continue Valsartan-HCTZ -Blood pressure is controlled.  HLD -Continue Lipitor 40 mg p.o. once daily (every evening)  DM 2 -Hemoglobin A1c of 7.7%.   -Glucophage and Glucotrol to resume post  discharge.  BPH -Continue Flomax  Depression -Continue Effexor  Okay to discharge home today.  Wound care:    Subjective:  Seen and examined this afternoon.  Pleasant elderly African-American male.  Not in distress.  Able to tolerate soft soft diet.   Discharge Exam:   Vitals:   02/12/20 2312 02/13/20 0336 02/13/20 0700 02/13/20 1122  BP: (!) 114/55 (!) 118/55 (!) 125/55 90/61  Pulse: 64 70 65 63  Resp: 14 16 15 19   Temp: 98 F (36.7 C) 98 F (36.7 C) 97.9 F (36.6 C) 98 F (36.7 C)  TempSrc: Oral Oral Oral Oral  SpO2: 96% 98% 97% 99%  Weight:      Height:        Body mass index is 27.26 kg/m.  General exam: Appears calm and comfortable.  Skin: No rashes, lesions or ulcers. HEENT: Atraumatic, normocephalic, supple neck, no obvious bleeding Lungs: Clear to auscultation bilaterally CVS: Regular rate and rhythm, no murmur GI/Abd soft, nontender, nondistended, bowel sounds present CNS: Alert, awake, oriented x3 Psychiatry: Mood appropriate Extremities: No pedal edema, no calf tenderness  Follow ups:   Discharge Instructions    Diet - low sodium heart healthy   Complete by: As directed    Diet Carb Modified   Complete by: As directed    Increase activity slowly   Complete by: As directed       Follow-up Information    [name], [name], MD Follow up.   Specialty: Family Medicine Contact information: [address] [place] [id number] [phone]               Recommendations for  Outpatient Follow-Up:   1. Follow-up with PCP as an outpatient in next 1 week for CBC and BMP Follow-up with GI as needed.  Discharge Instructions:  Follow with Primary MD Richardean Chimera, MD in 7 days   Get CBC/BMP checked in next visit within 1 week by PCP or SNF MD ( we routinely change or add medications that can affect your baseline labs and fluid status, therefore we recommend that you get the mentioned basic workup next visit with your PCP, your PCP may decide  not to get them or add new tests based on their clinical decision)  On your next visit with your PCP, please Get Medicines reviewed and adjusted.  Please request your PCP  to go over all Hospital Tests and Procedure/Radiological results at the follow up, please get all Hospital records sent to your Prim MD by signing hospital release before you go home.  Activity: As tolerated with Full fall precautions use walker/cane & assistance as needed  For Heart failure patients - Check your Weight same time everyday, if you gain over 2 pounds, or you develop in leg swelling, experience more shortness of breath or chest pain, call your Primary MD immediately. Follow Cardiac Low Salt Diet and 1.5 lit/day fluid restriction.  If you have smoked or chewed Tobacco in the last 2 yrs please stop smoking, stop any regular Alcohol  and or any Recreational drug use.  If you experience worsening of your admission symptoms, develop shortness of breath, life threatening emergency, suicidal or homicidal thoughts you must seek medical attention immediately by calling 911 or calling your MD immediately  if symptoms less severe.  You Must read complete instructions/literature along with all the possible adverse reactions/side effects for all the Medicines you take and that have been prescribed to you. Take any new Medicines after you have completely understood and accpet all the possible adverse reactions/side effects.   Do not drive, operate heavy machinery, perform activities at heights, swimming or participation in water activities or provide baby sitting services if your were admitted for syncope or siezures until you have seen by Primary MD or a Neurologist and advised to do so again.  Do not drive when taking Pain medications.  Do not take more than prescribed Pain, Sleep and Anxiety Medications  Wear Seat belts while driving.   Please note You were cared for by a hospitalist during your hospital stay. If you  have any questions about your discharge medications or the care you received while you were in the hospital after you are discharged, you can call the unit and asked to speak with the hospitalist on call if the hospitalist that took care of you is not available. Once you are discharged, your primary care physician will handle any further medical issues. Please note that NO REFILLS for any discharge medications will be authorized once you are discharged, as it is imperative that you return to your primary care physician (or establish a relationship with a primary care physician if you do not have one) for your aftercare needs so that they can reassess your need for medications and monitor your lab values.    Allergies as of 02/13/2020      Reactions   Codeine Other (See Comments)   Patient unsure, told by doctor.      Medication List    STOP taking these medications   aspirin EC 81 MG tablet     TAKE these medications   acetaminophen 650 MG CR tablet  Commonly known as: TYLENOL Take 650 mg by mouth every 8 (eight) hours as needed for pain.   atorvastatin 40 MG tablet Commonly known as: LIPITOR Take 40 mg by mouth every evening.   cetirizine 10 MG tablet Commonly known as: ZYRTEC Take 1 tablet by mouth daily.   EYE DROPS OP Apply 1 drop to eye daily as needed (FOR DRY EYE/IRRITATION).   Ginseng 250 MG Caps Take 250 mg by mouth daily.   glipiZIDE 10 MG 24 hr tablet Commonly known as: GLUCOTROL XL Take 10 mg by mouth every evening.   metFORMIN 500 MG 24 hr tablet Commonly known as: GLUCOPHAGE-XR Take 2,000 mg by mouth every evening.   MOVE FREE PO Take 1 tablet by mouth See admin instructions. Take 1 tablet by mouth twice daily EVERY OTHER DAY   Multivitamin Gummies Mens Chew Chew 2 Units by mouth daily.   omeprazole 20 MG capsule Commonly known as: PRILOSEC Take 20 mg by mouth daily.   psyllium 58.6 % packet Commonly known as: METAMUCIL Take 1 packet by mouth daily as  needed (constipation).   tamsulosin 0.4 MG Caps capsule Commonly known as: FLOMAX Take 0.4 mg by mouth at bedtime.   valsartan-hydrochlorothiazide 160-25 MG tablet Commonly known as: DIOVAN-HCT Take 1 tablet by mouth in the morning.   venlafaxine XR 37.5 MG 24 hr capsule Commonly known as: EFFEXOR-XR Take 37.5 mg by mouth at bedtime.       Time coordinating discharge: 35 minutes  The results of significant diagnostics from this hospitalization (including imaging, microbiology, ancillary and laboratory) are listed below for reference.    Procedures and Diagnostic Studies:   No results found.   Labs:   Basic Metabolic Panel: Recent Labs  Lab 02/09/20 1130 02/10/20 0239  NA 136 141  K 4.0 3.8  CL 103 106  CO2 26 26  GLUCOSE 166* 116*  BUN 14 10  CREATININE 1.14 1.18  CALCIUM 8.5* 8.6*   GFR Estimated Creatinine Clearance: 49.4 mL/min (by C-G formula based on SCr of 1.18 mg/dL). Liver Function Tests: Recent Labs  Lab 02/09/20 1130  AST 18  ALT 19  ALKPHOS 48  BILITOT 0.7  PROT 5.6*  ALBUMIN 3.2*   No results for input(s): LIPASE, AMYLASE in the last 168 hours. No results for input(s): AMMONIA in the last 168 hours. Coagulation profile Recent Labs  Lab 02/09/20 1130  INR 1.1    CBC: Recent Labs  Lab 02/09/20 1130 02/09/20 1130 02/10/20 0239 02/10/20 0239 02/10/20 1658 02/11/20 0137 02/11/20 0837 02/12/20 1250 02/13/20 0039  WBC 6.1  --  5.5  --   --   --   --   --   --   HGB 9.6*   < > 8.6*   < > 9.4* 8.8* 8.7* 8.7* 9.2*  HCT 29.0*   < > 26.6*   < > 28.4* 26.0* 27.4* 27.0* 28.1*  MCV 90.3  --  89.9  --   --   --   --   --   --   PLT 247  --  225  --   --   --   --   --   --    < > = values in this interval not displayed.   Cardiac Enzymes: No results for input(s): CKTOTAL, CKMB, CKMBINDEX, TROPONINI in the last 168 hours. BNP: Invalid input(s): POCBNP CBG: Recent Labs  Lab 02/12/20 1114 02/12/20 1600 02/12/20 2116 02/13/20 0647  02/13/20 1120  GLUCAP 120* 101*  134* 130* 135*   D-Dimer No results for input(s): DDIMER in the last 72 hours. Hgb A1c No results for input(s): HGBA1C in the last 72 hours. Lipid Profile No results for input(s): CHOL, HDL, LDLCALC, TRIG, CHOLHDL, LDLDIRECT in the last 72 hours. Thyroid function studies No results for input(s): TSH, T4TOTAL, T3FREE, THYROIDAB in the last 72 hours.  Invalid input(s): FREET3 Anemia work up No results for input(s): VITAMINB12, FOLATE, FERRITIN, TIBC, IRON, RETICCTPCT in the last 72 hours. Microbiology Recent Results (from the past 240 hour(s))  MRSA PCR Screening     Status: None   Collection Time: 02/09/20  6:31 AM   Specimen: Nasopharyngeal  Result Value Ref Range Status   MRSA by PCR NEGATIVE NEGATIVE Final    Comment:        The GeneXpert MRSA Assay (FDA approved for NASAL specimens only), is one component of a comprehensive MRSA colonization surveillance program. It is not intended to diagnose MRSA infection nor to guide or monitor treatment for MRSA infections. Performed at [facility], [address], [place], [place] [address]      Signed: Lorin Glass  [facility] 02/13/2020, 2:05 PM

## 2020-02-16 DIAGNOSIS — K5792 Diverticulitis of intestine, part unspecified, without perforation or abscess without bleeding: Secondary | ICD-10-CM | POA: Diagnosis not present

## 2020-02-16 DIAGNOSIS — D62 Acute posthemorrhagic anemia: Secondary | ICD-10-CM | POA: Diagnosis not present

## 2020-02-16 DIAGNOSIS — K5731 Diverticulosis of large intestine without perforation or abscess with bleeding: Secondary | ICD-10-CM | POA: Diagnosis not present

## 2020-02-17 DIAGNOSIS — D529 Folate deficiency anemia, unspecified: Secondary | ICD-10-CM | POA: Diagnosis not present

## 2020-02-17 DIAGNOSIS — D519 Vitamin B12 deficiency anemia, unspecified: Secondary | ICD-10-CM | POA: Diagnosis not present

## 2020-02-17 DIAGNOSIS — K5731 Diverticulosis of large intestine without perforation or abscess with bleeding: Secondary | ICD-10-CM | POA: Diagnosis not present

## 2020-02-17 DIAGNOSIS — D649 Anemia, unspecified: Secondary | ICD-10-CM | POA: Diagnosis not present

## 2020-02-17 DIAGNOSIS — N182 Chronic kidney disease, stage 2 (mild): Secondary | ICD-10-CM | POA: Diagnosis not present

## 2020-02-17 DIAGNOSIS — K5792 Diverticulitis of intestine, part unspecified, without perforation or abscess without bleeding: Secondary | ICD-10-CM | POA: Diagnosis not present

## 2020-02-23 DIAGNOSIS — E1122 Type 2 diabetes mellitus with diabetic chronic kidney disease: Secondary | ICD-10-CM | POA: Diagnosis not present

## 2020-02-23 DIAGNOSIS — I10 Essential (primary) hypertension: Secondary | ICD-10-CM | POA: Diagnosis not present

## 2020-02-23 DIAGNOSIS — N400 Enlarged prostate without lower urinary tract symptoms: Secondary | ICD-10-CM | POA: Diagnosis not present

## 2020-02-23 DIAGNOSIS — M25562 Pain in left knee: Secondary | ICD-10-CM | POA: Diagnosis not present

## 2020-02-23 DIAGNOSIS — M1712 Unilateral primary osteoarthritis, left knee: Secondary | ICD-10-CM | POA: Diagnosis not present

## 2020-02-23 DIAGNOSIS — E782 Mixed hyperlipidemia: Secondary | ICD-10-CM | POA: Diagnosis not present

## 2020-02-23 DIAGNOSIS — E1165 Type 2 diabetes mellitus with hyperglycemia: Secondary | ICD-10-CM | POA: Diagnosis not present

## 2020-02-23 DIAGNOSIS — Z6827 Body mass index (BMI) 27.0-27.9, adult: Secondary | ICD-10-CM | POA: Diagnosis not present

## 2020-03-06 DIAGNOSIS — R109 Unspecified abdominal pain: Secondary | ICD-10-CM | POA: Diagnosis not present

## 2020-03-06 DIAGNOSIS — K8020 Calculus of gallbladder without cholecystitis without obstruction: Secondary | ICD-10-CM | POA: Diagnosis not present

## 2020-03-06 DIAGNOSIS — R1084 Generalized abdominal pain: Secondary | ICD-10-CM | POA: Diagnosis not present

## 2020-03-06 DIAGNOSIS — R93422 Abnormal radiologic findings on diagnostic imaging of left kidney: Secondary | ICD-10-CM | POA: Diagnosis not present

## 2020-03-06 DIAGNOSIS — K824 Cholesterolosis of gallbladder: Secondary | ICD-10-CM | POA: Diagnosis not present

## 2020-03-12 DIAGNOSIS — R69 Illness, unspecified: Secondary | ICD-10-CM | POA: Diagnosis not present

## 2020-03-14 DIAGNOSIS — K5792 Diverticulitis of intestine, part unspecified, without perforation or abscess without bleeding: Secondary | ICD-10-CM | POA: Diagnosis not present

## 2020-03-14 DIAGNOSIS — Z6826 Body mass index (BMI) 26.0-26.9, adult: Secondary | ICD-10-CM | POA: Diagnosis not present

## 2020-03-14 DIAGNOSIS — K5731 Diverticulosis of large intestine without perforation or abscess with bleeding: Secondary | ICD-10-CM | POA: Diagnosis not present

## 2020-03-14 DIAGNOSIS — D62 Acute posthemorrhagic anemia: Secondary | ICD-10-CM | POA: Diagnosis not present

## 2020-04-05 DIAGNOSIS — E1122 Type 2 diabetes mellitus with diabetic chronic kidney disease: Secondary | ICD-10-CM | POA: Diagnosis not present

## 2020-04-05 DIAGNOSIS — N182 Chronic kidney disease, stage 2 (mild): Secondary | ICD-10-CM | POA: Diagnosis not present

## 2020-04-05 DIAGNOSIS — I129 Hypertensive chronic kidney disease with stage 1 through stage 4 chronic kidney disease, or unspecified chronic kidney disease: Secondary | ICD-10-CM | POA: Diagnosis not present

## 2020-04-06 DIAGNOSIS — Z23 Encounter for immunization: Secondary | ICD-10-CM | POA: Diagnosis not present

## 2020-04-06 DIAGNOSIS — Z6827 Body mass index (BMI) 27.0-27.9, adult: Secondary | ICD-10-CM | POA: Diagnosis not present

## 2020-04-06 DIAGNOSIS — M25562 Pain in left knee: Secondary | ICD-10-CM | POA: Diagnosis not present

## 2020-04-17 DIAGNOSIS — H40013 Open angle with borderline findings, low risk, bilateral: Secondary | ICD-10-CM | POA: Diagnosis not present

## 2020-04-17 DIAGNOSIS — E119 Type 2 diabetes mellitus without complications: Secondary | ICD-10-CM | POA: Diagnosis not present

## 2020-04-17 DIAGNOSIS — Z961 Presence of intraocular lens: Secondary | ICD-10-CM | POA: Diagnosis not present

## 2020-04-26 DIAGNOSIS — R600 Localized edema: Secondary | ICD-10-CM | POA: Diagnosis not present

## 2020-04-26 DIAGNOSIS — M25562 Pain in left knee: Secondary | ICD-10-CM | POA: Diagnosis not present

## 2020-05-05 DIAGNOSIS — N182 Chronic kidney disease, stage 2 (mild): Secondary | ICD-10-CM | POA: Diagnosis not present

## 2020-05-05 DIAGNOSIS — E1122 Type 2 diabetes mellitus with diabetic chronic kidney disease: Secondary | ICD-10-CM | POA: Diagnosis not present

## 2020-05-05 DIAGNOSIS — I129 Hypertensive chronic kidney disease with stage 1 through stage 4 chronic kidney disease, or unspecified chronic kidney disease: Secondary | ICD-10-CM | POA: Diagnosis not present

## 2020-05-05 DIAGNOSIS — E7849 Other hyperlipidemia: Secondary | ICD-10-CM | POA: Diagnosis not present

## 2020-05-07 DIAGNOSIS — M1712 Unilateral primary osteoarthritis, left knee: Secondary | ICD-10-CM | POA: Diagnosis not present

## 2020-05-09 DIAGNOSIS — Z23 Encounter for immunization: Secondary | ICD-10-CM | POA: Diagnosis not present

## 2020-05-18 DIAGNOSIS — R69 Illness, unspecified: Secondary | ICD-10-CM | POA: Diagnosis not present

## 2020-05-21 DIAGNOSIS — Z20828 Contact with and (suspected) exposure to other viral communicable diseases: Secondary | ICD-10-CM | POA: Diagnosis not present

## 2020-05-21 DIAGNOSIS — M940 Chondrocostal junction syndrome [Tietze]: Secondary | ICD-10-CM | POA: Diagnosis not present

## 2020-05-21 DIAGNOSIS — J069 Acute upper respiratory infection, unspecified: Secondary | ICD-10-CM | POA: Diagnosis not present

## 2020-05-21 DIAGNOSIS — R197 Diarrhea, unspecified: Secondary | ICD-10-CM | POA: Diagnosis not present

## 2020-05-25 DIAGNOSIS — E1142 Type 2 diabetes mellitus with diabetic polyneuropathy: Secondary | ICD-10-CM | POA: Diagnosis not present

## 2020-05-25 DIAGNOSIS — N182 Chronic kidney disease, stage 2 (mild): Secondary | ICD-10-CM | POA: Diagnosis not present

## 2020-05-25 DIAGNOSIS — E1122 Type 2 diabetes mellitus with diabetic chronic kidney disease: Secondary | ICD-10-CM | POA: Diagnosis not present

## 2020-05-25 DIAGNOSIS — I10 Essential (primary) hypertension: Secondary | ICD-10-CM | POA: Diagnosis not present

## 2020-05-25 DIAGNOSIS — E1165 Type 2 diabetes mellitus with hyperglycemia: Secondary | ICD-10-CM | POA: Diagnosis not present

## 2020-05-25 DIAGNOSIS — K219 Gastro-esophageal reflux disease without esophagitis: Secondary | ICD-10-CM | POA: Diagnosis not present

## 2020-05-25 DIAGNOSIS — E782 Mixed hyperlipidemia: Secondary | ICD-10-CM | POA: Diagnosis not present

## 2020-05-29 DIAGNOSIS — M545 Low back pain, unspecified: Secondary | ICD-10-CM | POA: Diagnosis not present

## 2020-05-29 DIAGNOSIS — E782 Mixed hyperlipidemia: Secondary | ICD-10-CM | POA: Diagnosis not present

## 2020-05-29 DIAGNOSIS — G4733 Obstructive sleep apnea (adult) (pediatric): Secondary | ICD-10-CM | POA: Diagnosis not present

## 2020-05-29 DIAGNOSIS — Z1212 Encounter for screening for malignant neoplasm of rectum: Secondary | ICD-10-CM | POA: Diagnosis not present

## 2020-05-29 DIAGNOSIS — M1712 Unilateral primary osteoarthritis, left knee: Secondary | ICD-10-CM | POA: Diagnosis not present

## 2020-05-29 DIAGNOSIS — R69 Illness, unspecified: Secondary | ICD-10-CM | POA: Diagnosis not present

## 2020-05-29 DIAGNOSIS — Z23 Encounter for immunization: Secondary | ICD-10-CM | POA: Diagnosis not present

## 2020-05-29 DIAGNOSIS — I10 Essential (primary) hypertension: Secondary | ICD-10-CM | POA: Diagnosis not present

## 2020-05-29 DIAGNOSIS — E1122 Type 2 diabetes mellitus with diabetic chronic kidney disease: Secondary | ICD-10-CM | POA: Diagnosis not present

## 2020-05-29 DIAGNOSIS — J301 Allergic rhinitis due to pollen: Secondary | ICD-10-CM | POA: Diagnosis not present

## 2020-05-29 DIAGNOSIS — N400 Enlarged prostate without lower urinary tract symptoms: Secondary | ICD-10-CM | POA: Diagnosis not present

## 2020-05-29 DIAGNOSIS — Z6829 Body mass index (BMI) 29.0-29.9, adult: Secondary | ICD-10-CM | POA: Diagnosis not present

## 2020-05-29 DIAGNOSIS — E1165 Type 2 diabetes mellitus with hyperglycemia: Secondary | ICD-10-CM | POA: Diagnosis not present

## 2020-06-08 DIAGNOSIS — R69 Illness, unspecified: Secondary | ICD-10-CM | POA: Diagnosis not present

## 2020-07-09 DIAGNOSIS — M48061 Spinal stenosis, lumbar region without neurogenic claudication: Secondary | ICD-10-CM | POA: Diagnosis not present

## 2020-07-09 DIAGNOSIS — M1712 Unilateral primary osteoarthritis, left knee: Secondary | ICD-10-CM | POA: Diagnosis not present

## 2020-07-09 DIAGNOSIS — M5416 Radiculopathy, lumbar region: Secondary | ICD-10-CM | POA: Diagnosis not present

## 2020-07-09 DIAGNOSIS — M1612 Unilateral primary osteoarthritis, left hip: Secondary | ICD-10-CM | POA: Diagnosis not present

## 2020-07-18 DIAGNOSIS — M4726 Other spondylosis with radiculopathy, lumbar region: Secondary | ICD-10-CM | POA: Diagnosis not present

## 2020-07-18 DIAGNOSIS — M9973 Connective tissue and disc stenosis of intervertebral foramina of lumbar region: Secondary | ICD-10-CM | POA: Diagnosis not present

## 2020-07-18 DIAGNOSIS — M5416 Radiculopathy, lumbar region: Secondary | ICD-10-CM | POA: Diagnosis not present

## 2020-07-18 DIAGNOSIS — M5116 Intervertebral disc disorders with radiculopathy, lumbar region: Secondary | ICD-10-CM | POA: Diagnosis not present

## 2020-07-18 DIAGNOSIS — M545 Low back pain, unspecified: Secondary | ICD-10-CM | POA: Diagnosis not present

## 2020-07-24 DIAGNOSIS — Z20828 Contact with and (suspected) exposure to other viral communicable diseases: Secondary | ICD-10-CM | POA: Diagnosis not present

## 2020-07-28 DIAGNOSIS — F331 Major depressive disorder, recurrent, moderate: Secondary | ICD-10-CM | POA: Diagnosis not present

## 2020-07-28 DIAGNOSIS — I517 Cardiomegaly: Secondary | ICD-10-CM | POA: Diagnosis not present

## 2020-07-28 DIAGNOSIS — E782 Mixed hyperlipidemia: Secondary | ICD-10-CM | POA: Diagnosis not present

## 2020-07-28 DIAGNOSIS — N401 Enlarged prostate with lower urinary tract symptoms: Secondary | ICD-10-CM | POA: Diagnosis not present

## 2020-07-28 DIAGNOSIS — I700 Atherosclerosis of aorta: Secondary | ICD-10-CM | POA: Diagnosis not present

## 2020-07-28 DIAGNOSIS — R0602 Shortness of breath: Secondary | ICD-10-CM | POA: Diagnosis not present

## 2020-07-28 DIAGNOSIS — I10 Essential (primary) hypertension: Secondary | ICD-10-CM | POA: Diagnosis not present

## 2020-07-28 DIAGNOSIS — R531 Weakness: Secondary | ICD-10-CM | POA: Diagnosis not present

## 2020-07-28 DIAGNOSIS — M5136 Other intervertebral disc degeneration, lumbar region: Secondary | ICD-10-CM | POA: Diagnosis not present

## 2020-07-28 DIAGNOSIS — Z743 Need for continuous supervision: Secondary | ICD-10-CM | POA: Diagnosis not present

## 2020-07-28 DIAGNOSIS — R69 Illness, unspecified: Secondary | ICD-10-CM | POA: Diagnosis not present

## 2020-07-28 DIAGNOSIS — J1282 Pneumonia due to coronavirus disease 2019: Secondary | ICD-10-CM | POA: Diagnosis not present

## 2020-07-28 DIAGNOSIS — U071 COVID-19: Secondary | ICD-10-CM | POA: Diagnosis not present

## 2020-07-28 DIAGNOSIS — I2699 Other pulmonary embolism without acute cor pulmonale: Secondary | ICD-10-CM | POA: Diagnosis not present

## 2020-07-28 DIAGNOSIS — R069 Unspecified abnormalities of breathing: Secondary | ICD-10-CM | POA: Diagnosis not present

## 2020-07-28 DIAGNOSIS — E1165 Type 2 diabetes mellitus with hyperglycemia: Secondary | ICD-10-CM | POA: Diagnosis not present

## 2020-07-28 DIAGNOSIS — K219 Gastro-esophageal reflux disease without esophagitis: Secondary | ICD-10-CM | POA: Diagnosis not present

## 2020-07-29 DIAGNOSIS — I10 Essential (primary) hypertension: Secondary | ICD-10-CM | POA: Diagnosis not present

## 2020-07-29 DIAGNOSIS — E1165 Type 2 diabetes mellitus with hyperglycemia: Secondary | ICD-10-CM | POA: Diagnosis not present

## 2020-07-29 DIAGNOSIS — U071 COVID-19: Secondary | ICD-10-CM | POA: Diagnosis not present

## 2020-07-30 DIAGNOSIS — E1165 Type 2 diabetes mellitus with hyperglycemia: Secondary | ICD-10-CM | POA: Diagnosis not present

## 2020-07-30 DIAGNOSIS — U071 COVID-19: Secondary | ICD-10-CM | POA: Diagnosis not present

## 2020-07-30 DIAGNOSIS — I10 Essential (primary) hypertension: Secondary | ICD-10-CM | POA: Diagnosis not present

## 2020-08-04 DIAGNOSIS — I129 Hypertensive chronic kidney disease with stage 1 through stage 4 chronic kidney disease, or unspecified chronic kidney disease: Secondary | ICD-10-CM | POA: Diagnosis not present

## 2020-08-04 DIAGNOSIS — E1122 Type 2 diabetes mellitus with diabetic chronic kidney disease: Secondary | ICD-10-CM | POA: Diagnosis not present

## 2020-08-04 DIAGNOSIS — E7849 Other hyperlipidemia: Secondary | ICD-10-CM | POA: Diagnosis not present

## 2020-08-04 DIAGNOSIS — N182 Chronic kidney disease, stage 2 (mild): Secondary | ICD-10-CM | POA: Diagnosis not present

## 2020-08-09 DIAGNOSIS — M48061 Spinal stenosis, lumbar region without neurogenic claudication: Secondary | ICD-10-CM | POA: Diagnosis not present

## 2020-08-09 DIAGNOSIS — M5416 Radiculopathy, lumbar region: Secondary | ICD-10-CM | POA: Diagnosis not present

## 2020-08-29 DIAGNOSIS — U071 COVID-19: Secondary | ICD-10-CM | POA: Diagnosis not present

## 2020-09-03 DIAGNOSIS — E1122 Type 2 diabetes mellitus with diabetic chronic kidney disease: Secondary | ICD-10-CM | POA: Diagnosis not present

## 2020-09-03 DIAGNOSIS — E7849 Other hyperlipidemia: Secondary | ICD-10-CM | POA: Diagnosis not present

## 2020-09-03 DIAGNOSIS — N182 Chronic kidney disease, stage 2 (mild): Secondary | ICD-10-CM | POA: Diagnosis not present

## 2020-09-03 DIAGNOSIS — I129 Hypertensive chronic kidney disease with stage 1 through stage 4 chronic kidney disease, or unspecified chronic kidney disease: Secondary | ICD-10-CM | POA: Diagnosis not present

## 2020-09-06 DIAGNOSIS — M1612 Unilateral primary osteoarthritis, left hip: Secondary | ICD-10-CM | POA: Diagnosis not present

## 2020-09-06 DIAGNOSIS — M5416 Radiculopathy, lumbar region: Secondary | ICD-10-CM | POA: Diagnosis not present

## 2020-09-06 DIAGNOSIS — M48061 Spinal stenosis, lumbar region without neurogenic claudication: Secondary | ICD-10-CM | POA: Diagnosis not present

## 2020-10-03 DIAGNOSIS — E1122 Type 2 diabetes mellitus with diabetic chronic kidney disease: Secondary | ICD-10-CM | POA: Diagnosis not present

## 2020-10-03 DIAGNOSIS — E1165 Type 2 diabetes mellitus with hyperglycemia: Secondary | ICD-10-CM | POA: Diagnosis not present

## 2020-10-03 DIAGNOSIS — Z1329 Encounter for screening for other suspected endocrine disorder: Secondary | ICD-10-CM | POA: Diagnosis not present

## 2020-10-03 DIAGNOSIS — I129 Hypertensive chronic kidney disease with stage 1 through stage 4 chronic kidney disease, or unspecified chronic kidney disease: Secondary | ICD-10-CM | POA: Diagnosis not present

## 2020-10-03 DIAGNOSIS — N182 Chronic kidney disease, stage 2 (mild): Secondary | ICD-10-CM | POA: Diagnosis not present

## 2020-10-03 DIAGNOSIS — E1142 Type 2 diabetes mellitus with diabetic polyneuropathy: Secondary | ICD-10-CM | POA: Diagnosis not present

## 2020-10-03 DIAGNOSIS — K219 Gastro-esophageal reflux disease without esophagitis: Secondary | ICD-10-CM | POA: Diagnosis not present

## 2020-10-03 DIAGNOSIS — E782 Mixed hyperlipidemia: Secondary | ICD-10-CM | POA: Diagnosis not present

## 2020-10-03 DIAGNOSIS — E7849 Other hyperlipidemia: Secondary | ICD-10-CM | POA: Diagnosis not present

## 2020-10-03 DIAGNOSIS — I10 Essential (primary) hypertension: Secondary | ICD-10-CM | POA: Diagnosis not present

## 2020-10-05 DIAGNOSIS — E1165 Type 2 diabetes mellitus with hyperglycemia: Secondary | ICD-10-CM | POA: Diagnosis not present

## 2020-10-05 DIAGNOSIS — N400 Enlarged prostate without lower urinary tract symptoms: Secondary | ICD-10-CM | POA: Diagnosis not present

## 2020-10-05 DIAGNOSIS — I10 Essential (primary) hypertension: Secondary | ICD-10-CM | POA: Diagnosis not present

## 2020-10-05 DIAGNOSIS — S39012A Strain of muscle, fascia and tendon of lower back, initial encounter: Secondary | ICD-10-CM | POA: Diagnosis not present

## 2020-10-05 DIAGNOSIS — E1122 Type 2 diabetes mellitus with diabetic chronic kidney disease: Secondary | ICD-10-CM | POA: Diagnosis not present

## 2020-10-05 DIAGNOSIS — E7849 Other hyperlipidemia: Secondary | ICD-10-CM | POA: Diagnosis not present

## 2020-10-05 DIAGNOSIS — Z7189 Other specified counseling: Secondary | ICD-10-CM | POA: Diagnosis not present

## 2020-10-05 DIAGNOSIS — R69 Illness, unspecified: Secondary | ICD-10-CM | POA: Diagnosis not present

## 2020-10-09 DIAGNOSIS — Z23 Encounter for immunization: Secondary | ICD-10-CM | POA: Diagnosis not present

## 2020-10-09 DIAGNOSIS — M48061 Spinal stenosis, lumbar region without neurogenic claudication: Secondary | ICD-10-CM | POA: Diagnosis not present

## 2020-10-09 DIAGNOSIS — M5416 Radiculopathy, lumbar region: Secondary | ICD-10-CM | POA: Diagnosis not present

## 2020-10-22 DIAGNOSIS — M5416 Radiculopathy, lumbar region: Secondary | ICD-10-CM | POA: Diagnosis not present

## 2020-11-03 DIAGNOSIS — E7849 Other hyperlipidemia: Secondary | ICD-10-CM | POA: Diagnosis not present

## 2020-11-03 DIAGNOSIS — E1122 Type 2 diabetes mellitus with diabetic chronic kidney disease: Secondary | ICD-10-CM | POA: Diagnosis not present

## 2020-11-03 DIAGNOSIS — I129 Hypertensive chronic kidney disease with stage 1 through stage 4 chronic kidney disease, or unspecified chronic kidney disease: Secondary | ICD-10-CM | POA: Diagnosis not present

## 2020-11-03 DIAGNOSIS — N182 Chronic kidney disease, stage 2 (mild): Secondary | ICD-10-CM | POA: Diagnosis not present

## 2020-11-06 DIAGNOSIS — M48061 Spinal stenosis, lumbar region without neurogenic claudication: Secondary | ICD-10-CM | POA: Diagnosis not present

## 2020-11-06 DIAGNOSIS — M5416 Radiculopathy, lumbar region: Secondary | ICD-10-CM | POA: Diagnosis not present

## 2020-11-08 DIAGNOSIS — M5416 Radiculopathy, lumbar region: Secondary | ICD-10-CM | POA: Diagnosis not present

## 2020-11-12 DIAGNOSIS — M48061 Spinal stenosis, lumbar region without neurogenic claudication: Secondary | ICD-10-CM | POA: Diagnosis not present

## 2020-11-12 DIAGNOSIS — M5416 Radiculopathy, lumbar region: Secondary | ICD-10-CM | POA: Diagnosis not present

## 2020-12-03 DIAGNOSIS — I129 Hypertensive chronic kidney disease with stage 1 through stage 4 chronic kidney disease, or unspecified chronic kidney disease: Secondary | ICD-10-CM | POA: Diagnosis not present

## 2020-12-03 DIAGNOSIS — N182 Chronic kidney disease, stage 2 (mild): Secondary | ICD-10-CM | POA: Diagnosis not present

## 2020-12-03 DIAGNOSIS — E7849 Other hyperlipidemia: Secondary | ICD-10-CM | POA: Diagnosis not present

## 2020-12-03 DIAGNOSIS — E1122 Type 2 diabetes mellitus with diabetic chronic kidney disease: Secondary | ICD-10-CM | POA: Diagnosis not present

## 2020-12-04 DIAGNOSIS — M48061 Spinal stenosis, lumbar region without neurogenic claudication: Secondary | ICD-10-CM | POA: Diagnosis not present

## 2020-12-04 DIAGNOSIS — M5416 Radiculopathy, lumbar region: Secondary | ICD-10-CM | POA: Diagnosis not present

## 2021-01-17 DIAGNOSIS — L609 Nail disorder, unspecified: Secondary | ICD-10-CM | POA: Diagnosis not present

## 2021-01-17 DIAGNOSIS — M79671 Pain in right foot: Secondary | ICD-10-CM | POA: Diagnosis not present

## 2021-01-17 DIAGNOSIS — M79672 Pain in left foot: Secondary | ICD-10-CM | POA: Diagnosis not present

## 2021-01-17 DIAGNOSIS — B351 Tinea unguium: Secondary | ICD-10-CM | POA: Diagnosis not present

## 2021-01-17 DIAGNOSIS — B353 Tinea pedis: Secondary | ICD-10-CM | POA: Diagnosis not present

## 2021-01-17 DIAGNOSIS — M79675 Pain in left toe(s): Secondary | ICD-10-CM | POA: Diagnosis not present

## 2021-01-17 DIAGNOSIS — M79674 Pain in right toe(s): Secondary | ICD-10-CM | POA: Diagnosis not present

## 2021-01-17 IMAGING — CT CT ABD-PELV W/ CM
2 of 5 series · 12 of 36 positions shown, 15 images · IV contrast (Isovue)
Comparison: Chest radiograph 07/12/2018. Abdominal CT 04/10/2017

CLINICAL DATA: Chest pain or SOB, pleurisy or effusion suspected;
Abd pain, acute, generalized.

EXAM:
CT CHEST, ABDOMEN, AND PELVIS WITH CONTRAST
TECHNIQUE: Multidetector CT imaging of the chest, abdomen and pelvis was
performed following the standard protocol during bolus
administration of intravenous contrast.
CONTRAST:  100mL TD2Q1Q-5MM IOPAMIDOL (TD2Q1Q-5MM) INJECTION 61%

[Series 2: cap with · axial · 0.73mm/px · z∈[+985,+1510]mm · 9 of 133 slices shown, 12 images]
[im 14/133  mediastinal]
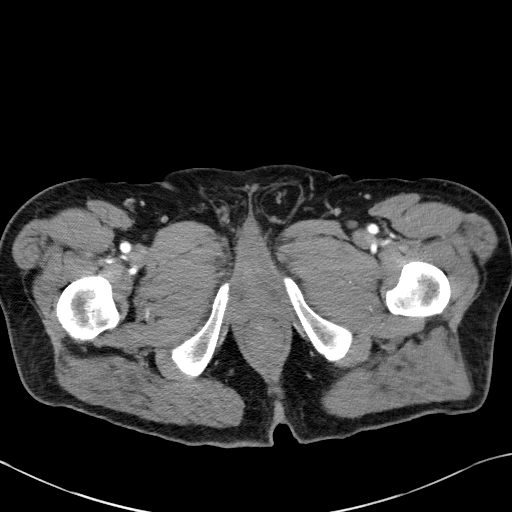
[im 14/133  lung]
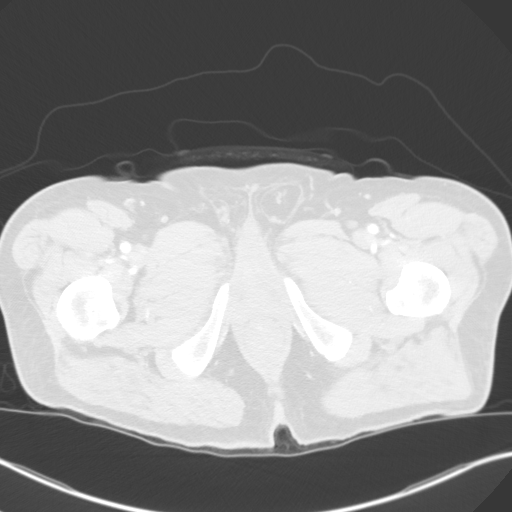
[im 27/133  lung]
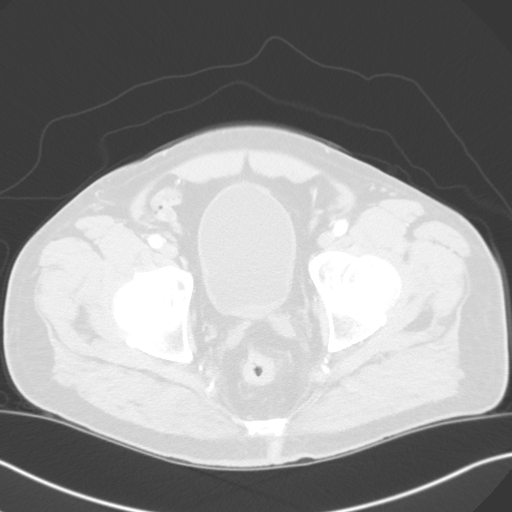
[im 40/133  lung]
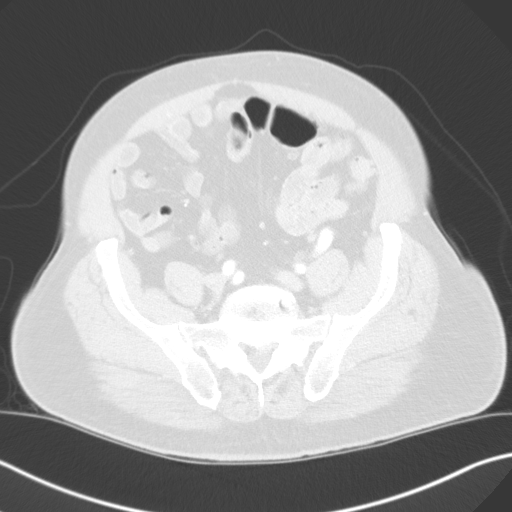
[im 53/133  lung]
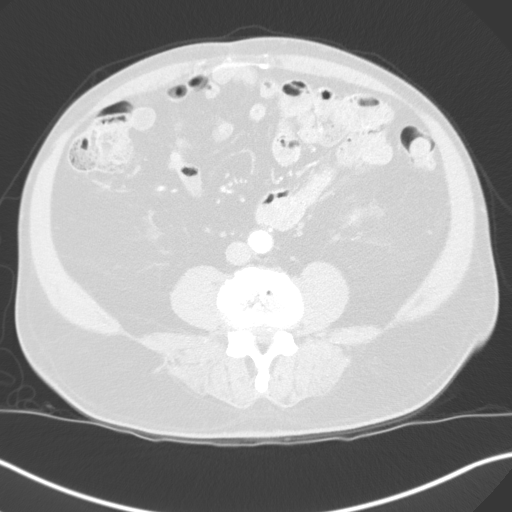
[im 67/133  mediastinal]
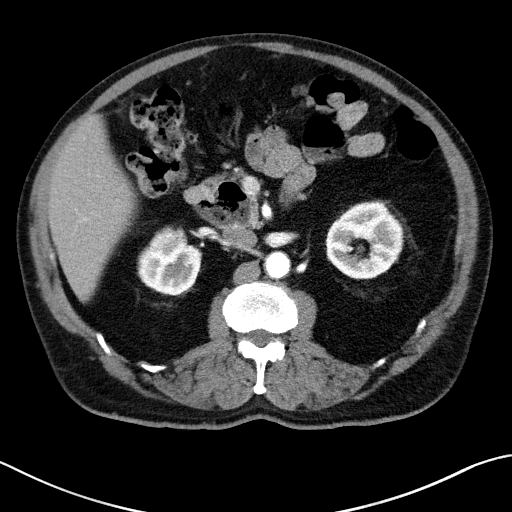
[im 67/133  lung]
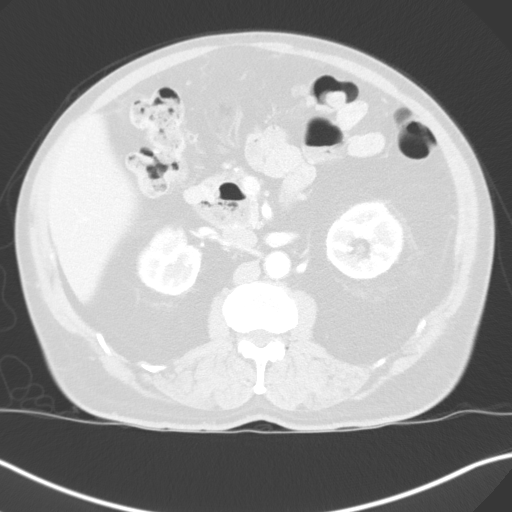
[im 80/133  lung]
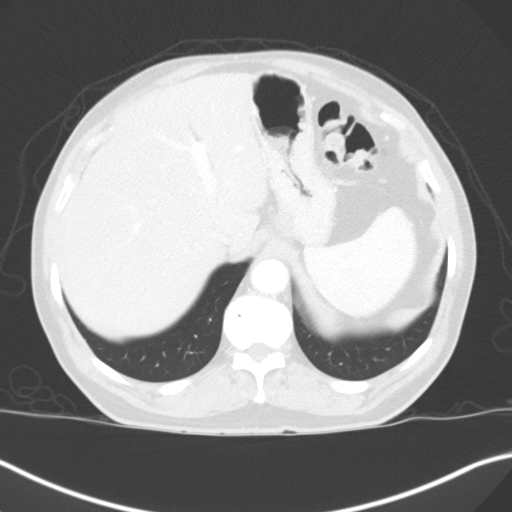
[im 93/133  lung]
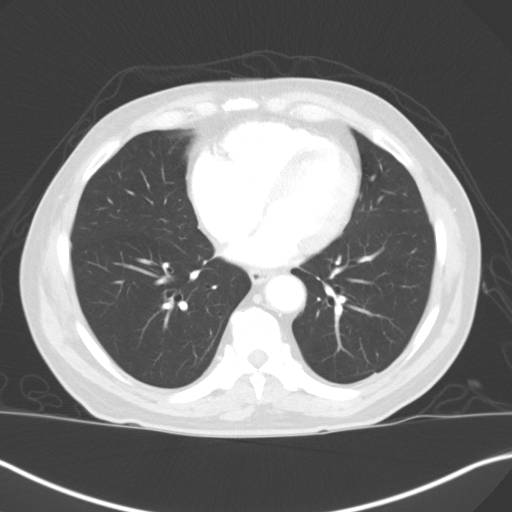
[im 106/133  lung]
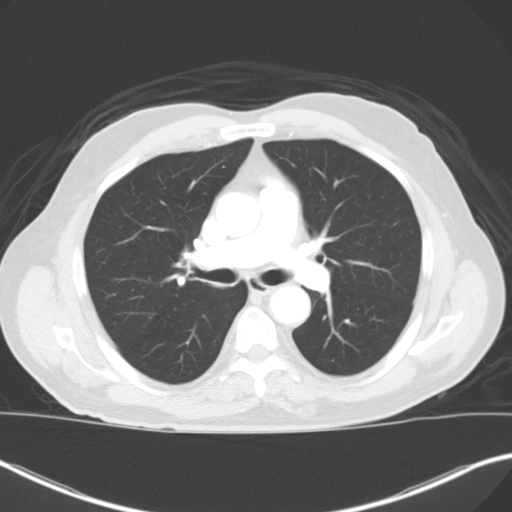
[im 119/133  mediastinal]
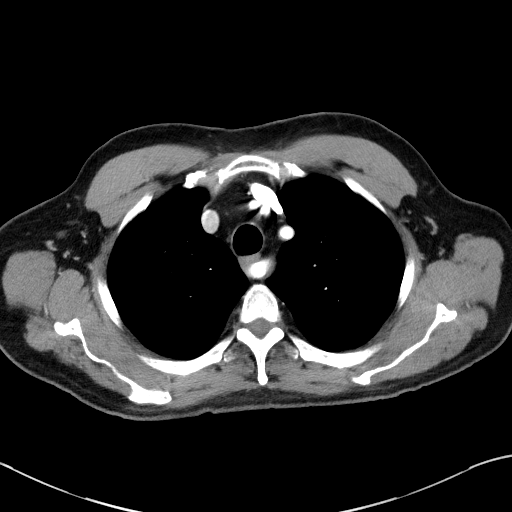
[im 119/133  lung]
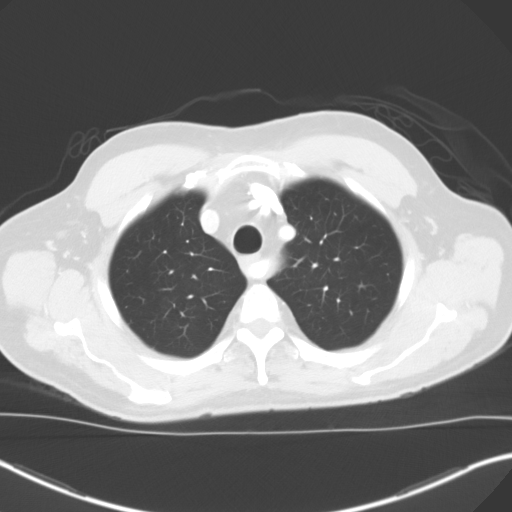

[Series 4: coronals · coronal · 0.83mm/px · 3 of 179 slices shown]
[im 36/179  lung]
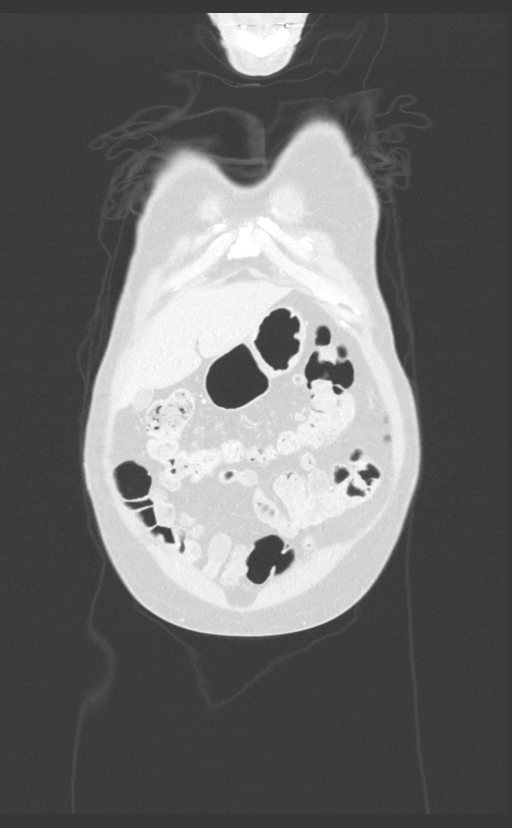
[im 72/179  lung]
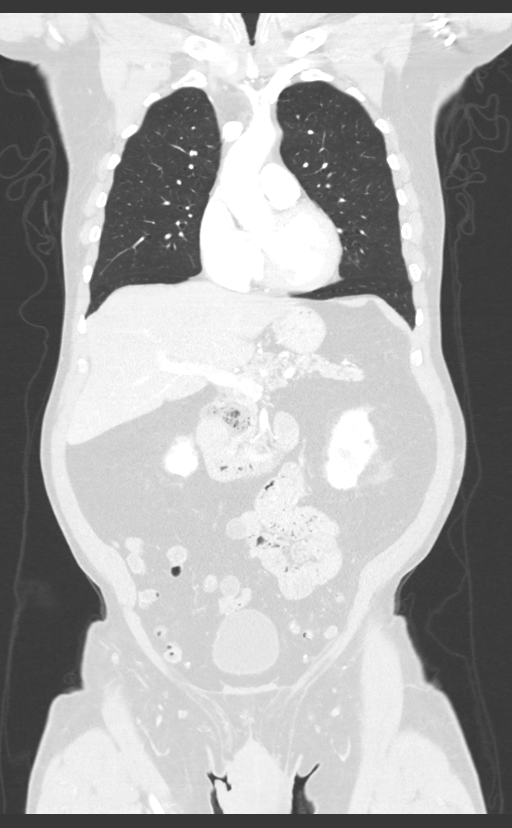
[im 107/179  lung]
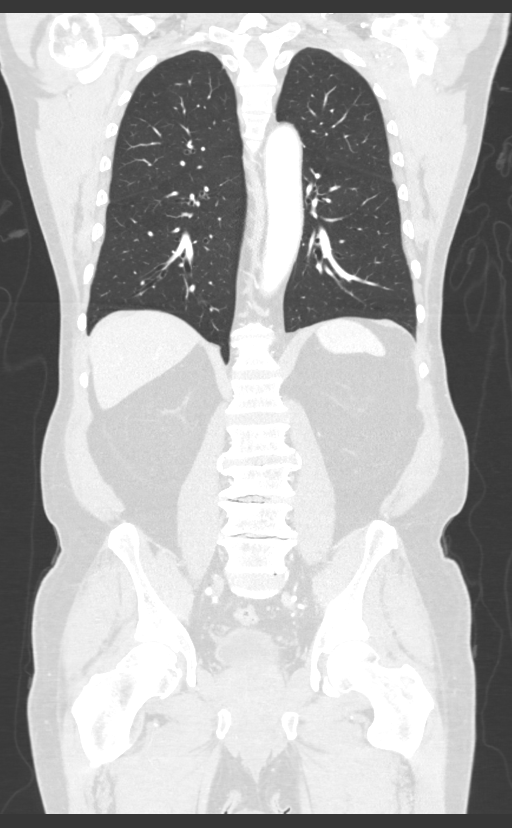

[12 of 36 positions shown; findings below may reference images not displayed]

FINDINGS: CT CHEST FINDINGS

Cardiovascular: No evidence of aortic dissection or pulmonary
embolus. Aberrant right subclavian artery coursing posterior to the
esophagus, normal variant anatomy. Heart is normal in size with
slight right heart dilatation. No pericardial effusion.

Mediastinum/Nodes: No enlarged mediastinal or hilar lymph nodes.
Esophagus is decompressed. No visualized thyroid nodule.

Lungs/Pleura: No consolidation or pulmonary edema. No pleural fluid.
No pulmonary mass. Trace pleural thickening in the left lower lobe
likely related scarring. Trachea and mainstem bronchi are patent.

Musculoskeletal: Degenerative change in the spine in the right
sternoclavicular joint. Ghost tracks in the right proximal humerus.
There are no acute or suspicious osseous abnormalities.

CT ABDOMEN PELVIS FINDINGS

Hepatobiliary: Scattered small hepatic hypodensities are too small
to characterize, but likely represent simple cysts. Small calcified
granuloma in the caudate. Mild hepatic steatosis. Gallbladder
physiologically distended, no calcified stone. No biliary
dilatation.

Pancreas: No ductal dilatation or inflammation.

Spleen: Normal in size. Scattered calcified granuloma.

Adrenals/Urinary Tract: No adrenal nodule. No hydronephrosis or
perinephric edema. Homogeneous renal enhancement with symmetric
excretion on delayed phase imaging. Small nonobstructing stones in
the right kidney. Mild right renal cortical scarring. Small
subcentimeter hypodensities in the left greater than right kidney
are too small to characterize but likely cysts. Urinary bladder is
physiologically distended. Mild wall thickening about the bladder
base.

Stomach/Bowel: The stomach is decompressed. Moderate to large
duodenal diverticulum at the level of the pancreatic head. No
associated inflammatory change. No small bowel obstruction,
inflammatory change or evidence of wall thickening. Normal appendix.
Colonic diverticulosis most prominent in the distal colon without
acute diverticulitis.

Vascular/Lymphatic: Mild aortic atherosclerosis without aneurysm or
dissection. No enlarged lymph nodes in the abdomen or pelvis.

Reproductive: Enlarged prostate gland spanning 5.8 cm.

Other: Fat containing left inguinal hernia. No free air or free
fluid. Prior ventral abdominal wall hernia repair with mesh.

Musculoskeletal: Multilevel degenerative change in the lumbar spine
with vacuum phenomena. There are no acute or suspicious osseous
abnormalities.
IMPRESSION: 1. No acute abnormality in the chest, abdomen, or pelvis.
2. Nonobstructing right renal stones. Colonic diverticulosis without
acute diverticulitis.
3. Enlarged prostate gland. Mild bladder base wall thickening is
likely secondary to chronic bladder outlet obstruction.
4. Mild hepatic steatosis.

Aortic Atherosclerosis (WV2G5-GE2.2).

## 2021-02-08 DIAGNOSIS — E1165 Type 2 diabetes mellitus with hyperglycemia: Secondary | ICD-10-CM | POA: Diagnosis not present

## 2021-02-08 DIAGNOSIS — E1122 Type 2 diabetes mellitus with diabetic chronic kidney disease: Secondary | ICD-10-CM | POA: Diagnosis not present

## 2021-02-08 DIAGNOSIS — E782 Mixed hyperlipidemia: Secondary | ICD-10-CM | POA: Diagnosis not present

## 2021-02-08 DIAGNOSIS — I10 Essential (primary) hypertension: Secondary | ICD-10-CM | POA: Diagnosis not present

## 2021-02-08 DIAGNOSIS — E7849 Other hyperlipidemia: Secondary | ICD-10-CM | POA: Diagnosis not present

## 2021-02-08 DIAGNOSIS — K219 Gastro-esophageal reflux disease without esophagitis: Secondary | ICD-10-CM | POA: Diagnosis not present

## 2021-02-08 DIAGNOSIS — N182 Chronic kidney disease, stage 2 (mild): Secondary | ICD-10-CM | POA: Diagnosis not present

## 2021-02-08 DIAGNOSIS — Z1329 Encounter for screening for other suspected endocrine disorder: Secondary | ICD-10-CM | POA: Diagnosis not present

## 2021-02-12 DIAGNOSIS — Z23 Encounter for immunization: Secondary | ICD-10-CM | POA: Diagnosis not present

## 2021-02-12 DIAGNOSIS — I10 Essential (primary) hypertension: Secondary | ICD-10-CM | POA: Diagnosis not present

## 2021-02-12 DIAGNOSIS — E1122 Type 2 diabetes mellitus with diabetic chronic kidney disease: Secondary | ICD-10-CM | POA: Diagnosis not present

## 2021-02-12 DIAGNOSIS — Z0001 Encounter for general adult medical examination with abnormal findings: Secondary | ICD-10-CM | POA: Diagnosis not present

## 2021-02-12 DIAGNOSIS — E7849 Other hyperlipidemia: Secondary | ICD-10-CM | POA: Diagnosis not present

## 2021-02-12 DIAGNOSIS — E1165 Type 2 diabetes mellitus with hyperglycemia: Secondary | ICD-10-CM | POA: Diagnosis not present

## 2021-02-12 DIAGNOSIS — I700 Atherosclerosis of aorta: Secondary | ICD-10-CM | POA: Diagnosis not present

## 2021-02-14 DIAGNOSIS — M79675 Pain in left toe(s): Secondary | ICD-10-CM | POA: Diagnosis not present

## 2021-02-14 DIAGNOSIS — M79671 Pain in right foot: Secondary | ICD-10-CM | POA: Diagnosis not present

## 2021-02-14 DIAGNOSIS — B353 Tinea pedis: Secondary | ICD-10-CM | POA: Diagnosis not present

## 2021-02-14 DIAGNOSIS — M79674 Pain in right toe(s): Secondary | ICD-10-CM | POA: Diagnosis not present

## 2021-02-14 DIAGNOSIS — M79672 Pain in left foot: Secondary | ICD-10-CM | POA: Diagnosis not present

## 2021-02-14 DIAGNOSIS — L609 Nail disorder, unspecified: Secondary | ICD-10-CM | POA: Diagnosis not present

## 2021-02-14 DIAGNOSIS — B351 Tinea unguium: Secondary | ICD-10-CM | POA: Diagnosis not present

## 2021-02-25 DIAGNOSIS — M79675 Pain in left toe(s): Secondary | ICD-10-CM | POA: Diagnosis not present

## 2021-02-25 DIAGNOSIS — I739 Peripheral vascular disease, unspecified: Secondary | ICD-10-CM | POA: Diagnosis not present

## 2021-02-25 DIAGNOSIS — M79672 Pain in left foot: Secondary | ICD-10-CM | POA: Diagnosis not present

## 2021-02-25 DIAGNOSIS — M79674 Pain in right toe(s): Secondary | ICD-10-CM | POA: Diagnosis not present

## 2021-02-25 DIAGNOSIS — M79671 Pain in right foot: Secondary | ICD-10-CM | POA: Diagnosis not present

## 2021-02-25 DIAGNOSIS — L110 Acquired keratosis follicularis: Secondary | ICD-10-CM | POA: Diagnosis not present

## 2021-03-06 DIAGNOSIS — I129 Hypertensive chronic kidney disease with stage 1 through stage 4 chronic kidney disease, or unspecified chronic kidney disease: Secondary | ICD-10-CM | POA: Diagnosis not present

## 2021-03-06 DIAGNOSIS — E1122 Type 2 diabetes mellitus with diabetic chronic kidney disease: Secondary | ICD-10-CM | POA: Diagnosis not present

## 2021-03-06 DIAGNOSIS — E7849 Other hyperlipidemia: Secondary | ICD-10-CM | POA: Diagnosis not present

## 2021-03-06 DIAGNOSIS — N182 Chronic kidney disease, stage 2 (mild): Secondary | ICD-10-CM | POA: Diagnosis not present

## 2021-05-06 DIAGNOSIS — N182 Chronic kidney disease, stage 2 (mild): Secondary | ICD-10-CM | POA: Diagnosis not present

## 2021-05-06 DIAGNOSIS — M722 Plantar fascial fibromatosis: Secondary | ICD-10-CM | POA: Diagnosis not present

## 2021-05-06 DIAGNOSIS — E7849 Other hyperlipidemia: Secondary | ICD-10-CM | POA: Diagnosis not present

## 2021-05-06 DIAGNOSIS — M79671 Pain in right foot: Secondary | ICD-10-CM | POA: Diagnosis not present

## 2021-05-06 DIAGNOSIS — M7731 Calcaneal spur, right foot: Secondary | ICD-10-CM | POA: Diagnosis not present

## 2021-05-06 DIAGNOSIS — E1122 Type 2 diabetes mellitus with diabetic chronic kidney disease: Secondary | ICD-10-CM | POA: Diagnosis not present

## 2021-05-06 DIAGNOSIS — I129 Hypertensive chronic kidney disease with stage 1 through stage 4 chronic kidney disease, or unspecified chronic kidney disease: Secondary | ICD-10-CM | POA: Diagnosis not present

## 2021-05-08 DIAGNOSIS — N481 Balanitis: Secondary | ICD-10-CM | POA: Diagnosis not present

## 2021-05-08 DIAGNOSIS — Z23 Encounter for immunization: Secondary | ICD-10-CM | POA: Diagnosis not present

## 2021-06-03 DIAGNOSIS — M722 Plantar fascial fibromatosis: Secondary | ICD-10-CM | POA: Diagnosis not present

## 2021-06-03 DIAGNOSIS — M79671 Pain in right foot: Secondary | ICD-10-CM | POA: Diagnosis not present

## 2021-06-03 DIAGNOSIS — M7731 Calcaneal spur, right foot: Secondary | ICD-10-CM | POA: Diagnosis not present

## 2021-06-05 DIAGNOSIS — N182 Chronic kidney disease, stage 2 (mild): Secondary | ICD-10-CM | POA: Diagnosis not present

## 2021-06-05 DIAGNOSIS — E1122 Type 2 diabetes mellitus with diabetic chronic kidney disease: Secondary | ICD-10-CM | POA: Diagnosis not present

## 2021-06-05 DIAGNOSIS — I129 Hypertensive chronic kidney disease with stage 1 through stage 4 chronic kidney disease, or unspecified chronic kidney disease: Secondary | ICD-10-CM | POA: Diagnosis not present

## 2021-06-05 DIAGNOSIS — E7849 Other hyperlipidemia: Secondary | ICD-10-CM | POA: Diagnosis not present

## 2021-06-18 DIAGNOSIS — I10 Essential (primary) hypertension: Secondary | ICD-10-CM | POA: Diagnosis not present

## 2021-06-18 DIAGNOSIS — E1122 Type 2 diabetes mellitus with diabetic chronic kidney disease: Secondary | ICD-10-CM | POA: Diagnosis not present

## 2021-06-18 DIAGNOSIS — E7849 Other hyperlipidemia: Secondary | ICD-10-CM | POA: Diagnosis not present

## 2021-06-18 DIAGNOSIS — E1165 Type 2 diabetes mellitus with hyperglycemia: Secondary | ICD-10-CM | POA: Diagnosis not present

## 2021-06-18 DIAGNOSIS — N182 Chronic kidney disease, stage 2 (mild): Secondary | ICD-10-CM | POA: Diagnosis not present

## 2021-06-18 DIAGNOSIS — E782 Mixed hyperlipidemia: Secondary | ICD-10-CM | POA: Diagnosis not present

## 2021-06-20 DIAGNOSIS — J301 Allergic rhinitis due to pollen: Secondary | ICD-10-CM | POA: Diagnosis not present

## 2021-06-20 DIAGNOSIS — E782 Mixed hyperlipidemia: Secondary | ICD-10-CM | POA: Diagnosis not present

## 2021-06-20 DIAGNOSIS — E1122 Type 2 diabetes mellitus with diabetic chronic kidney disease: Secondary | ICD-10-CM | POA: Diagnosis not present

## 2021-06-20 DIAGNOSIS — I10 Essential (primary) hypertension: Secondary | ICD-10-CM | POA: Diagnosis not present

## 2021-06-20 DIAGNOSIS — E1165 Type 2 diabetes mellitus with hyperglycemia: Secondary | ICD-10-CM | POA: Diagnosis not present

## 2021-06-20 DIAGNOSIS — G4733 Obstructive sleep apnea (adult) (pediatric): Secondary | ICD-10-CM | POA: Diagnosis not present

## 2021-06-20 DIAGNOSIS — M545 Low back pain, unspecified: Secondary | ICD-10-CM | POA: Diagnosis not present

## 2021-06-20 DIAGNOSIS — I700 Atherosclerosis of aorta: Secondary | ICD-10-CM | POA: Diagnosis not present

## 2021-06-20 DIAGNOSIS — E7849 Other hyperlipidemia: Secondary | ICD-10-CM | POA: Diagnosis not present

## 2021-06-20 DIAGNOSIS — Z1212 Encounter for screening for malignant neoplasm of rectum: Secondary | ICD-10-CM | POA: Diagnosis not present

## 2021-06-20 DIAGNOSIS — N400 Enlarged prostate without lower urinary tract symptoms: Secondary | ICD-10-CM | POA: Diagnosis not present

## 2021-06-20 DIAGNOSIS — R69 Illness, unspecified: Secondary | ICD-10-CM | POA: Diagnosis not present

## 2021-06-20 DIAGNOSIS — M25562 Pain in left knee: Secondary | ICD-10-CM | POA: Diagnosis not present

## 2021-06-20 DIAGNOSIS — M1712 Unilateral primary osteoarthritis, left knee: Secondary | ICD-10-CM | POA: Diagnosis not present

## 2021-06-20 DIAGNOSIS — Z23 Encounter for immunization: Secondary | ICD-10-CM | POA: Diagnosis not present

## 2021-06-26 DIAGNOSIS — H40013 Open angle with borderline findings, low risk, bilateral: Secondary | ICD-10-CM | POA: Diagnosis not present

## 2021-06-26 DIAGNOSIS — E119 Type 2 diabetes mellitus without complications: Secondary | ICD-10-CM | POA: Diagnosis not present

## 2021-07-16 DIAGNOSIS — M722 Plantar fascial fibromatosis: Secondary | ICD-10-CM | POA: Diagnosis not present

## 2021-07-16 DIAGNOSIS — M7731 Calcaneal spur, right foot: Secondary | ICD-10-CM | POA: Diagnosis not present

## 2021-07-16 DIAGNOSIS — M79671 Pain in right foot: Secondary | ICD-10-CM | POA: Diagnosis not present

## 2021-08-04 DIAGNOSIS — E7849 Other hyperlipidemia: Secondary | ICD-10-CM | POA: Diagnosis not present

## 2021-08-04 DIAGNOSIS — N182 Chronic kidney disease, stage 2 (mild): Secondary | ICD-10-CM | POA: Diagnosis not present

## 2021-08-04 DIAGNOSIS — I129 Hypertensive chronic kidney disease with stage 1 through stage 4 chronic kidney disease, or unspecified chronic kidney disease: Secondary | ICD-10-CM | POA: Diagnosis not present

## 2021-08-04 DIAGNOSIS — E1122 Type 2 diabetes mellitus with diabetic chronic kidney disease: Secondary | ICD-10-CM | POA: Diagnosis not present

## 2021-08-15 DIAGNOSIS — R1011 Right upper quadrant pain: Secondary | ICD-10-CM | POA: Diagnosis not present

## 2021-08-15 DIAGNOSIS — Z6826 Body mass index (BMI) 26.0-26.9, adult: Secondary | ICD-10-CM | POA: Diagnosis not present

## 2021-08-15 DIAGNOSIS — M542 Cervicalgia: Secondary | ICD-10-CM | POA: Diagnosis not present

## 2021-08-19 DIAGNOSIS — N189 Chronic kidney disease, unspecified: Secondary | ICD-10-CM | POA: Diagnosis not present

## 2021-08-19 DIAGNOSIS — R1011 Right upper quadrant pain: Secondary | ICD-10-CM | POA: Diagnosis not present

## 2021-09-03 DIAGNOSIS — I129 Hypertensive chronic kidney disease with stage 1 through stage 4 chronic kidney disease, or unspecified chronic kidney disease: Secondary | ICD-10-CM | POA: Diagnosis not present

## 2021-09-03 DIAGNOSIS — E1122 Type 2 diabetes mellitus with diabetic chronic kidney disease: Secondary | ICD-10-CM | POA: Diagnosis not present

## 2021-09-12 DIAGNOSIS — E782 Mixed hyperlipidemia: Secondary | ICD-10-CM | POA: Diagnosis not present

## 2021-09-12 DIAGNOSIS — I10 Essential (primary) hypertension: Secondary | ICD-10-CM | POA: Diagnosis not present

## 2021-09-12 DIAGNOSIS — E7849 Other hyperlipidemia: Secondary | ICD-10-CM | POA: Diagnosis not present

## 2021-09-12 DIAGNOSIS — N183 Chronic kidney disease, stage 3 unspecified: Secondary | ICD-10-CM | POA: Diagnosis not present

## 2021-09-12 DIAGNOSIS — E1165 Type 2 diabetes mellitus with hyperglycemia: Secondary | ICD-10-CM | POA: Diagnosis not present

## 2021-09-12 DIAGNOSIS — K219 Gastro-esophageal reflux disease without esophagitis: Secondary | ICD-10-CM | POA: Diagnosis not present

## 2021-09-12 DIAGNOSIS — N182 Chronic kidney disease, stage 2 (mild): Secondary | ICD-10-CM | POA: Diagnosis not present

## 2021-09-18 DIAGNOSIS — E1122 Type 2 diabetes mellitus with diabetic chronic kidney disease: Secondary | ICD-10-CM | POA: Diagnosis not present

## 2021-09-18 DIAGNOSIS — K1120 Sialoadenitis, unspecified: Secondary | ICD-10-CM | POA: Diagnosis not present

## 2021-09-18 DIAGNOSIS — E1165 Type 2 diabetes mellitus with hyperglycemia: Secondary | ICD-10-CM | POA: Diagnosis not present

## 2021-09-18 DIAGNOSIS — N400 Enlarged prostate without lower urinary tract symptoms: Secondary | ICD-10-CM | POA: Diagnosis not present

## 2021-09-18 DIAGNOSIS — Z6826 Body mass index (BMI) 26.0-26.9, adult: Secondary | ICD-10-CM | POA: Diagnosis not present

## 2021-09-18 DIAGNOSIS — E7849 Other hyperlipidemia: Secondary | ICD-10-CM | POA: Diagnosis not present

## 2021-09-18 DIAGNOSIS — I700 Atherosclerosis of aorta: Secondary | ICD-10-CM | POA: Diagnosis not present

## 2021-09-18 DIAGNOSIS — I10 Essential (primary) hypertension: Secondary | ICD-10-CM | POA: Diagnosis not present

## 2021-11-03 DIAGNOSIS — I129 Hypertensive chronic kidney disease with stage 1 through stage 4 chronic kidney disease, or unspecified chronic kidney disease: Secondary | ICD-10-CM | POA: Diagnosis not present

## 2021-11-03 DIAGNOSIS — N182 Chronic kidney disease, stage 2 (mild): Secondary | ICD-10-CM | POA: Diagnosis not present

## 2021-11-03 DIAGNOSIS — E1122 Type 2 diabetes mellitus with diabetic chronic kidney disease: Secondary | ICD-10-CM | POA: Diagnosis not present

## 2021-11-03 DIAGNOSIS — E7849 Other hyperlipidemia: Secondary | ICD-10-CM | POA: Diagnosis not present

## 2021-12-04 DIAGNOSIS — E7849 Other hyperlipidemia: Secondary | ICD-10-CM | POA: Diagnosis not present

## 2021-12-04 DIAGNOSIS — N182 Chronic kidney disease, stage 2 (mild): Secondary | ICD-10-CM | POA: Diagnosis not present

## 2021-12-04 DIAGNOSIS — E1122 Type 2 diabetes mellitus with diabetic chronic kidney disease: Secondary | ICD-10-CM | POA: Diagnosis not present

## 2021-12-04 DIAGNOSIS — I129 Hypertensive chronic kidney disease with stage 1 through stage 4 chronic kidney disease, or unspecified chronic kidney disease: Secondary | ICD-10-CM | POA: Diagnosis not present

## 2021-12-06 DIAGNOSIS — M26622 Arthralgia of left temporomandibular joint: Secondary | ICD-10-CM | POA: Diagnosis not present

## 2021-12-06 DIAGNOSIS — Z6826 Body mass index (BMI) 26.0-26.9, adult: Secondary | ICD-10-CM | POA: Diagnosis not present

## 2021-12-30 DIAGNOSIS — Z6825 Body mass index (BMI) 25.0-25.9, adult: Secondary | ICD-10-CM | POA: Diagnosis not present

## 2021-12-30 DIAGNOSIS — R030 Elevated blood-pressure reading, without diagnosis of hypertension: Secondary | ICD-10-CM | POA: Diagnosis not present

## 2021-12-30 DIAGNOSIS — Z20828 Contact with and (suspected) exposure to other viral communicable diseases: Secondary | ICD-10-CM | POA: Diagnosis not present

## 2021-12-30 DIAGNOSIS — J019 Acute sinusitis, unspecified: Secondary | ICD-10-CM | POA: Diagnosis not present

## 2021-12-30 DIAGNOSIS — J209 Acute bronchitis, unspecified: Secondary | ICD-10-CM | POA: Diagnosis not present

## 2022-01-03 DIAGNOSIS — N182 Chronic kidney disease, stage 2 (mild): Secondary | ICD-10-CM | POA: Diagnosis not present

## 2022-01-03 DIAGNOSIS — E7849 Other hyperlipidemia: Secondary | ICD-10-CM | POA: Diagnosis not present

## 2022-01-03 DIAGNOSIS — E1122 Type 2 diabetes mellitus with diabetic chronic kidney disease: Secondary | ICD-10-CM | POA: Diagnosis not present

## 2022-01-22 DIAGNOSIS — E1165 Type 2 diabetes mellitus with hyperglycemia: Secondary | ICD-10-CM | POA: Diagnosis not present

## 2022-01-22 DIAGNOSIS — K219 Gastro-esophageal reflux disease without esophagitis: Secondary | ICD-10-CM | POA: Diagnosis not present

## 2022-01-22 DIAGNOSIS — E1122 Type 2 diabetes mellitus with diabetic chronic kidney disease: Secondary | ICD-10-CM | POA: Diagnosis not present

## 2022-01-22 DIAGNOSIS — E782 Mixed hyperlipidemia: Secondary | ICD-10-CM | POA: Diagnosis not present

## 2022-01-22 DIAGNOSIS — E7849 Other hyperlipidemia: Secondary | ICD-10-CM | POA: Diagnosis not present

## 2022-01-22 DIAGNOSIS — I10 Essential (primary) hypertension: Secondary | ICD-10-CM | POA: Diagnosis not present

## 2022-01-22 DIAGNOSIS — N189 Chronic kidney disease, unspecified: Secondary | ICD-10-CM | POA: Diagnosis not present

## 2022-01-29 DIAGNOSIS — I700 Atherosclerosis of aorta: Secondary | ICD-10-CM | POA: Diagnosis not present

## 2022-01-29 DIAGNOSIS — N400 Enlarged prostate without lower urinary tract symptoms: Secondary | ICD-10-CM | POA: Diagnosis not present

## 2022-01-29 DIAGNOSIS — R69 Illness, unspecified: Secondary | ICD-10-CM | POA: Diagnosis not present

## 2022-01-29 DIAGNOSIS — E1165 Type 2 diabetes mellitus with hyperglycemia: Secondary | ICD-10-CM | POA: Diagnosis not present

## 2022-01-29 DIAGNOSIS — G4733 Obstructive sleep apnea (adult) (pediatric): Secondary | ICD-10-CM | POA: Diagnosis not present

## 2022-01-29 DIAGNOSIS — J301 Allergic rhinitis due to pollen: Secondary | ICD-10-CM | POA: Diagnosis not present

## 2022-01-29 DIAGNOSIS — E7849 Other hyperlipidemia: Secondary | ICD-10-CM | POA: Diagnosis not present

## 2022-01-29 DIAGNOSIS — I10 Essential (primary) hypertension: Secondary | ICD-10-CM | POA: Diagnosis not present

## 2022-01-29 DIAGNOSIS — Z23 Encounter for immunization: Secondary | ICD-10-CM | POA: Diagnosis not present

## 2022-01-29 DIAGNOSIS — L03313 Cellulitis of chest wall: Secondary | ICD-10-CM | POA: Diagnosis not present

## 2022-01-29 DIAGNOSIS — M25562 Pain in left knee: Secondary | ICD-10-CM | POA: Diagnosis not present

## 2022-01-29 DIAGNOSIS — E1122 Type 2 diabetes mellitus with diabetic chronic kidney disease: Secondary | ICD-10-CM | POA: Diagnosis not present

## 2022-02-12 DIAGNOSIS — E7849 Other hyperlipidemia: Secondary | ICD-10-CM | POA: Diagnosis not present

## 2022-02-12 DIAGNOSIS — D649 Anemia, unspecified: Secondary | ICD-10-CM | POA: Diagnosis not present

## 2022-02-12 DIAGNOSIS — I10 Essential (primary) hypertension: Secondary | ICD-10-CM | POA: Diagnosis not present

## 2022-02-12 DIAGNOSIS — K219 Gastro-esophageal reflux disease without esophagitis: Secondary | ICD-10-CM | POA: Diagnosis not present

## 2022-02-12 DIAGNOSIS — E119 Type 2 diabetes mellitus without complications: Secondary | ICD-10-CM | POA: Diagnosis not present

## 2022-02-12 DIAGNOSIS — E039 Hypothyroidism, unspecified: Secondary | ICD-10-CM | POA: Diagnosis not present

## 2022-02-17 DIAGNOSIS — Z0001 Encounter for general adult medical examination with abnormal findings: Secondary | ICD-10-CM | POA: Diagnosis not present

## 2022-02-17 DIAGNOSIS — I700 Atherosclerosis of aorta: Secondary | ICD-10-CM | POA: Diagnosis not present

## 2022-02-17 DIAGNOSIS — N400 Enlarged prostate without lower urinary tract symptoms: Secondary | ICD-10-CM | POA: Diagnosis not present

## 2022-02-17 DIAGNOSIS — E7849 Other hyperlipidemia: Secondary | ICD-10-CM | POA: Diagnosis not present

## 2022-02-17 DIAGNOSIS — M1712 Unilateral primary osteoarthritis, left knee: Secondary | ICD-10-CM | POA: Diagnosis not present

## 2022-02-17 DIAGNOSIS — R69 Illness, unspecified: Secondary | ICD-10-CM | POA: Diagnosis not present

## 2022-02-17 DIAGNOSIS — E1165 Type 2 diabetes mellitus with hyperglycemia: Secondary | ICD-10-CM | POA: Diagnosis not present

## 2022-02-17 DIAGNOSIS — Z6825 Body mass index (BMI) 25.0-25.9, adult: Secondary | ICD-10-CM | POA: Diagnosis not present

## 2022-02-17 DIAGNOSIS — M25562 Pain in left knee: Secondary | ICD-10-CM | POA: Diagnosis not present

## 2022-02-17 DIAGNOSIS — I10 Essential (primary) hypertension: Secondary | ICD-10-CM | POA: Diagnosis not present

## 2022-02-17 DIAGNOSIS — E1122 Type 2 diabetes mellitus with diabetic chronic kidney disease: Secondary | ICD-10-CM | POA: Diagnosis not present

## 2022-02-17 DIAGNOSIS — G4733 Obstructive sleep apnea (adult) (pediatric): Secondary | ICD-10-CM | POA: Diagnosis not present

## 2022-03-06 DIAGNOSIS — E782 Mixed hyperlipidemia: Secondary | ICD-10-CM | POA: Diagnosis not present

## 2022-03-06 DIAGNOSIS — I10 Essential (primary) hypertension: Secondary | ICD-10-CM | POA: Diagnosis not present

## 2022-03-31 DIAGNOSIS — Z20828 Contact with and (suspected) exposure to other viral communicable diseases: Secondary | ICD-10-CM | POA: Diagnosis not present

## 2022-03-31 DIAGNOSIS — J019 Acute sinusitis, unspecified: Secondary | ICD-10-CM | POA: Diagnosis not present

## 2022-03-31 DIAGNOSIS — Z6825 Body mass index (BMI) 25.0-25.9, adult: Secondary | ICD-10-CM | POA: Diagnosis not present

## 2022-03-31 DIAGNOSIS — R030 Elevated blood-pressure reading, without diagnosis of hypertension: Secondary | ICD-10-CM | POA: Diagnosis not present

## 2022-03-31 DIAGNOSIS — J209 Acute bronchitis, unspecified: Secondary | ICD-10-CM | POA: Diagnosis not present

## 2022-04-03 DIAGNOSIS — J329 Chronic sinusitis, unspecified: Secondary | ICD-10-CM | POA: Diagnosis not present

## 2022-04-03 DIAGNOSIS — Z6825 Body mass index (BMI) 25.0-25.9, adult: Secondary | ICD-10-CM | POA: Diagnosis not present

## 2022-04-03 DIAGNOSIS — J40 Bronchitis, not specified as acute or chronic: Secondary | ICD-10-CM | POA: Diagnosis not present

## 2022-04-09 DIAGNOSIS — Z23 Encounter for immunization: Secondary | ICD-10-CM | POA: Diagnosis not present

## 2022-04-24 DIAGNOSIS — Z23 Encounter for immunization: Secondary | ICD-10-CM | POA: Diagnosis not present

## 2022-06-12 DIAGNOSIS — R030 Elevated blood-pressure reading, without diagnosis of hypertension: Secondary | ICD-10-CM | POA: Diagnosis not present

## 2022-06-12 DIAGNOSIS — Z6825 Body mass index (BMI) 25.0-25.9, adult: Secondary | ICD-10-CM | POA: Diagnosis not present

## 2022-06-12 DIAGNOSIS — R1032 Left lower quadrant pain: Secondary | ICD-10-CM | POA: Diagnosis not present

## 2022-06-12 DIAGNOSIS — E1165 Type 2 diabetes mellitus with hyperglycemia: Secondary | ICD-10-CM | POA: Diagnosis not present

## 2022-06-18 DIAGNOSIS — K219 Gastro-esophageal reflux disease without esophagitis: Secondary | ICD-10-CM | POA: Diagnosis not present

## 2022-06-18 DIAGNOSIS — I10 Essential (primary) hypertension: Secondary | ICD-10-CM | POA: Diagnosis not present

## 2022-06-18 DIAGNOSIS — E7849 Other hyperlipidemia: Secondary | ICD-10-CM | POA: Diagnosis not present

## 2022-06-18 DIAGNOSIS — E1165 Type 2 diabetes mellitus with hyperglycemia: Secondary | ICD-10-CM | POA: Diagnosis not present

## 2022-06-18 DIAGNOSIS — E1122 Type 2 diabetes mellitus with diabetic chronic kidney disease: Secondary | ICD-10-CM | POA: Diagnosis not present

## 2022-06-25 DIAGNOSIS — I700 Atherosclerosis of aorta: Secondary | ICD-10-CM | POA: Diagnosis not present

## 2022-06-25 DIAGNOSIS — E1122 Type 2 diabetes mellitus with diabetic chronic kidney disease: Secondary | ICD-10-CM | POA: Diagnosis not present

## 2022-06-25 DIAGNOSIS — N400 Enlarged prostate without lower urinary tract symptoms: Secondary | ICD-10-CM | POA: Diagnosis not present

## 2022-06-25 DIAGNOSIS — E7849 Other hyperlipidemia: Secondary | ICD-10-CM | POA: Diagnosis not present

## 2022-06-25 DIAGNOSIS — E1165 Type 2 diabetes mellitus with hyperglycemia: Secondary | ICD-10-CM | POA: Diagnosis not present

## 2022-06-25 DIAGNOSIS — Z1212 Encounter for screening for malignant neoplasm of rectum: Secondary | ICD-10-CM | POA: Diagnosis not present

## 2022-06-25 DIAGNOSIS — R69 Illness, unspecified: Secondary | ICD-10-CM | POA: Diagnosis not present

## 2022-06-25 DIAGNOSIS — Z23 Encounter for immunization: Secondary | ICD-10-CM | POA: Diagnosis not present

## 2022-06-25 DIAGNOSIS — I10 Essential (primary) hypertension: Secondary | ICD-10-CM | POA: Diagnosis not present

## 2022-06-25 DIAGNOSIS — E782 Mixed hyperlipidemia: Secondary | ICD-10-CM | POA: Diagnosis not present

## 2022-06-25 DIAGNOSIS — M25562 Pain in left knee: Secondary | ICD-10-CM | POA: Diagnosis not present

## 2022-06-25 DIAGNOSIS — J301 Allergic rhinitis due to pollen: Secondary | ICD-10-CM | POA: Diagnosis not present

## 2022-06-25 DIAGNOSIS — G4733 Obstructive sleep apnea (adult) (pediatric): Secondary | ICD-10-CM | POA: Diagnosis not present

## 2022-06-25 DIAGNOSIS — M545 Low back pain, unspecified: Secondary | ICD-10-CM | POA: Diagnosis not present

## 2022-06-25 DIAGNOSIS — K5792 Diverticulitis of intestine, part unspecified, without perforation or abscess without bleeding: Secondary | ICD-10-CM | POA: Diagnosis not present

## 2022-06-25 DIAGNOSIS — M1712 Unilateral primary osteoarthritis, left knee: Secondary | ICD-10-CM | POA: Diagnosis not present

## 2022-07-10 DIAGNOSIS — H40013 Open angle with borderline findings, low risk, bilateral: Secondary | ICD-10-CM | POA: Diagnosis not present

## 2022-07-10 DIAGNOSIS — Z961 Presence of intraocular lens: Secondary | ICD-10-CM | POA: Diagnosis not present

## 2022-07-10 DIAGNOSIS — E119 Type 2 diabetes mellitus without complications: Secondary | ICD-10-CM | POA: Diagnosis not present

## 2022-07-29 DIAGNOSIS — M545 Low back pain, unspecified: Secondary | ICD-10-CM | POA: Diagnosis not present

## 2022-07-29 DIAGNOSIS — R21 Rash and other nonspecific skin eruption: Secondary | ICD-10-CM | POA: Diagnosis not present

## 2022-07-29 DIAGNOSIS — Z6825 Body mass index (BMI) 25.0-25.9, adult: Secondary | ICD-10-CM | POA: Diagnosis not present

## 2022-07-29 DIAGNOSIS — R030 Elevated blood-pressure reading, without diagnosis of hypertension: Secondary | ICD-10-CM | POA: Diagnosis not present

## 2022-08-04 DIAGNOSIS — M545 Low back pain, unspecified: Secondary | ICD-10-CM | POA: Diagnosis not present

## 2022-08-06 DIAGNOSIS — E782 Mixed hyperlipidemia: Secondary | ICD-10-CM | POA: Diagnosis not present

## 2022-08-06 DIAGNOSIS — I10 Essential (primary) hypertension: Secondary | ICD-10-CM | POA: Diagnosis not present

## 2022-08-13 DIAGNOSIS — Z6825 Body mass index (BMI) 25.0-25.9, adult: Secondary | ICD-10-CM | POA: Diagnosis not present

## 2022-08-13 DIAGNOSIS — R059 Cough, unspecified: Secondary | ICD-10-CM | POA: Diagnosis not present

## 2022-08-13 DIAGNOSIS — R030 Elevated blood-pressure reading, without diagnosis of hypertension: Secondary | ICD-10-CM | POA: Diagnosis not present

## 2022-08-13 DIAGNOSIS — Z20828 Contact with and (suspected) exposure to other viral communicable diseases: Secondary | ICD-10-CM | POA: Diagnosis not present

## 2022-08-26 DIAGNOSIS — M48061 Spinal stenosis, lumbar region without neurogenic claudication: Secondary | ICD-10-CM | POA: Diagnosis not present

## 2022-08-26 DIAGNOSIS — M5416 Radiculopathy, lumbar region: Secondary | ICD-10-CM | POA: Diagnosis not present

## 2022-08-26 DIAGNOSIS — M5126 Other intervertebral disc displacement, lumbar region: Secondary | ICD-10-CM | POA: Diagnosis not present

## 2022-09-03 DIAGNOSIS — M545 Low back pain, unspecified: Secondary | ICD-10-CM | POA: Diagnosis not present

## 2022-09-04 DIAGNOSIS — I10 Essential (primary) hypertension: Secondary | ICD-10-CM | POA: Diagnosis not present

## 2022-09-04 DIAGNOSIS — E782 Mixed hyperlipidemia: Secondary | ICD-10-CM | POA: Diagnosis not present

## 2022-10-27 DIAGNOSIS — N189 Chronic kidney disease, unspecified: Secondary | ICD-10-CM | POA: Diagnosis not present

## 2022-10-27 DIAGNOSIS — E1165 Type 2 diabetes mellitus with hyperglycemia: Secondary | ICD-10-CM | POA: Diagnosis not present

## 2022-10-27 DIAGNOSIS — G4733 Obstructive sleep apnea (adult) (pediatric): Secondary | ICD-10-CM | POA: Diagnosis not present

## 2022-10-27 DIAGNOSIS — E039 Hypothyroidism, unspecified: Secondary | ICD-10-CM | POA: Diagnosis not present

## 2022-10-27 DIAGNOSIS — E7849 Other hyperlipidemia: Secondary | ICD-10-CM | POA: Diagnosis not present

## 2022-10-27 DIAGNOSIS — E1122 Type 2 diabetes mellitus with diabetic chronic kidney disease: Secondary | ICD-10-CM | POA: Diagnosis not present

## 2022-11-03 DIAGNOSIS — I10 Essential (primary) hypertension: Secondary | ICD-10-CM | POA: Diagnosis not present

## 2022-11-03 DIAGNOSIS — N400 Enlarged prostate without lower urinary tract symptoms: Secondary | ICD-10-CM | POA: Diagnosis not present

## 2022-11-03 DIAGNOSIS — G4733 Obstructive sleep apnea (adult) (pediatric): Secondary | ICD-10-CM | POA: Diagnosis not present

## 2022-11-03 DIAGNOSIS — M545 Low back pain, unspecified: Secondary | ICD-10-CM | POA: Diagnosis not present

## 2022-11-03 DIAGNOSIS — E1165 Type 2 diabetes mellitus with hyperglycemia: Secondary | ICD-10-CM | POA: Diagnosis not present

## 2022-11-03 DIAGNOSIS — Z6825 Body mass index (BMI) 25.0-25.9, adult: Secondary | ICD-10-CM | POA: Diagnosis not present

## 2022-11-03 DIAGNOSIS — I700 Atherosclerosis of aorta: Secondary | ICD-10-CM | POA: Diagnosis not present

## 2022-11-03 DIAGNOSIS — E7849 Other hyperlipidemia: Secondary | ICD-10-CM | POA: Diagnosis not present

## 2022-11-03 DIAGNOSIS — M25562 Pain in left knee: Secondary | ICD-10-CM | POA: Diagnosis not present

## 2022-11-03 DIAGNOSIS — M1712 Unilateral primary osteoarthritis, left knee: Secondary | ICD-10-CM | POA: Diagnosis not present

## 2022-11-03 DIAGNOSIS — J301 Allergic rhinitis due to pollen: Secondary | ICD-10-CM | POA: Diagnosis not present

## 2022-11-03 DIAGNOSIS — F331 Major depressive disorder, recurrent, moderate: Secondary | ICD-10-CM | POA: Diagnosis not present

## 2022-11-04 DIAGNOSIS — I10 Essential (primary) hypertension: Secondary | ICD-10-CM | POA: Diagnosis not present

## 2022-11-04 DIAGNOSIS — E782 Mixed hyperlipidemia: Secondary | ICD-10-CM | POA: Diagnosis not present

## 2022-12-05 DIAGNOSIS — E782 Mixed hyperlipidemia: Secondary | ICD-10-CM | POA: Diagnosis not present

## 2022-12-05 DIAGNOSIS — I10 Essential (primary) hypertension: Secondary | ICD-10-CM | POA: Diagnosis not present

## 2022-12-08 DIAGNOSIS — Z6825 Body mass index (BMI) 25.0-25.9, adult: Secondary | ICD-10-CM | POA: Diagnosis not present

## 2022-12-08 DIAGNOSIS — I10 Essential (primary) hypertension: Secondary | ICD-10-CM | POA: Diagnosis not present

## 2022-12-08 DIAGNOSIS — F331 Major depressive disorder, recurrent, moderate: Secondary | ICD-10-CM | POA: Diagnosis not present

## 2022-12-08 DIAGNOSIS — E7849 Other hyperlipidemia: Secondary | ICD-10-CM | POA: Diagnosis not present

## 2022-12-08 DIAGNOSIS — J301 Allergic rhinitis due to pollen: Secondary | ICD-10-CM | POA: Diagnosis not present

## 2022-12-08 DIAGNOSIS — I700 Atherosclerosis of aorta: Secondary | ICD-10-CM | POA: Diagnosis not present

## 2022-12-08 DIAGNOSIS — N400 Enlarged prostate without lower urinary tract symptoms: Secondary | ICD-10-CM | POA: Diagnosis not present

## 2022-12-08 DIAGNOSIS — M1712 Unilateral primary osteoarthritis, left knee: Secondary | ICD-10-CM | POA: Diagnosis not present

## 2022-12-08 DIAGNOSIS — M545 Low back pain, unspecified: Secondary | ICD-10-CM | POA: Diagnosis not present

## 2022-12-08 DIAGNOSIS — E1165 Type 2 diabetes mellitus with hyperglycemia: Secondary | ICD-10-CM | POA: Diagnosis not present

## 2022-12-08 DIAGNOSIS — G4733 Obstructive sleep apnea (adult) (pediatric): Secondary | ICD-10-CM | POA: Diagnosis not present

## 2022-12-08 DIAGNOSIS — M25562 Pain in left knee: Secondary | ICD-10-CM | POA: Diagnosis not present

## 2023-03-02 DIAGNOSIS — R739 Hyperglycemia, unspecified: Secondary | ICD-10-CM | POA: Diagnosis not present

## 2023-03-02 DIAGNOSIS — E1122 Type 2 diabetes mellitus with diabetic chronic kidney disease: Secondary | ICD-10-CM | POA: Diagnosis not present

## 2023-03-02 DIAGNOSIS — E559 Vitamin D deficiency, unspecified: Secondary | ICD-10-CM | POA: Diagnosis not present

## 2023-03-02 DIAGNOSIS — E782 Mixed hyperlipidemia: Secondary | ICD-10-CM | POA: Diagnosis not present

## 2023-03-02 DIAGNOSIS — E1165 Type 2 diabetes mellitus with hyperglycemia: Secondary | ICD-10-CM | POA: Diagnosis not present

## 2023-03-02 DIAGNOSIS — N182 Chronic kidney disease, stage 2 (mild): Secondary | ICD-10-CM | POA: Diagnosis not present

## 2023-03-02 DIAGNOSIS — E039 Hypothyroidism, unspecified: Secondary | ICD-10-CM | POA: Diagnosis not present

## 2023-03-02 DIAGNOSIS — I10 Essential (primary) hypertension: Secondary | ICD-10-CM | POA: Diagnosis not present

## 2023-03-10 DIAGNOSIS — M1712 Unilateral primary osteoarthritis, left knee: Secondary | ICD-10-CM | POA: Diagnosis not present

## 2023-03-10 DIAGNOSIS — M545 Low back pain, unspecified: Secondary | ICD-10-CM | POA: Diagnosis not present

## 2023-03-10 DIAGNOSIS — J301 Allergic rhinitis due to pollen: Secondary | ICD-10-CM | POA: Diagnosis not present

## 2023-03-10 DIAGNOSIS — N400 Enlarged prostate without lower urinary tract symptoms: Secondary | ICD-10-CM | POA: Diagnosis not present

## 2023-03-10 DIAGNOSIS — E7849 Other hyperlipidemia: Secondary | ICD-10-CM | POA: Diagnosis not present

## 2023-03-10 DIAGNOSIS — E1165 Type 2 diabetes mellitus with hyperglycemia: Secondary | ICD-10-CM | POA: Diagnosis not present

## 2023-03-10 DIAGNOSIS — Z23 Encounter for immunization: Secondary | ICD-10-CM | POA: Diagnosis not present

## 2023-03-10 DIAGNOSIS — I10 Essential (primary) hypertension: Secondary | ICD-10-CM | POA: Diagnosis not present

## 2023-03-10 DIAGNOSIS — I700 Atherosclerosis of aorta: Secondary | ICD-10-CM | POA: Diagnosis not present

## 2023-03-10 DIAGNOSIS — Z0001 Encounter for general adult medical examination with abnormal findings: Secondary | ICD-10-CM | POA: Diagnosis not present

## 2023-03-10 DIAGNOSIS — F331 Major depressive disorder, recurrent, moderate: Secondary | ICD-10-CM | POA: Diagnosis not present

## 2023-03-10 DIAGNOSIS — G4733 Obstructive sleep apnea (adult) (pediatric): Secondary | ICD-10-CM | POA: Diagnosis not present

## 2023-04-02 DIAGNOSIS — M79672 Pain in left foot: Secondary | ICD-10-CM | POA: Diagnosis not present

## 2023-04-02 DIAGNOSIS — M79675 Pain in left toe(s): Secondary | ICD-10-CM | POA: Diagnosis not present

## 2023-04-02 DIAGNOSIS — M79674 Pain in right toe(s): Secondary | ICD-10-CM | POA: Diagnosis not present

## 2023-04-02 DIAGNOSIS — I739 Peripheral vascular disease, unspecified: Secondary | ICD-10-CM | POA: Diagnosis not present

## 2023-04-02 DIAGNOSIS — E1140 Type 2 diabetes mellitus with diabetic neuropathy, unspecified: Secondary | ICD-10-CM | POA: Diagnosis not present

## 2023-04-02 DIAGNOSIS — M79671 Pain in right foot: Secondary | ICD-10-CM | POA: Diagnosis not present

## 2023-05-07 DIAGNOSIS — N400 Enlarged prostate without lower urinary tract symptoms: Secondary | ICD-10-CM | POA: Diagnosis not present

## 2023-05-07 DIAGNOSIS — E1122 Type 2 diabetes mellitus with diabetic chronic kidney disease: Secondary | ICD-10-CM | POA: Diagnosis not present

## 2023-05-07 DIAGNOSIS — E559 Vitamin D deficiency, unspecified: Secondary | ICD-10-CM | POA: Diagnosis not present

## 2023-05-07 DIAGNOSIS — E782 Mixed hyperlipidemia: Secondary | ICD-10-CM | POA: Diagnosis not present

## 2023-05-14 DIAGNOSIS — I2510 Atherosclerotic heart disease of native coronary artery without angina pectoris: Secondary | ICD-10-CM | POA: Diagnosis not present

## 2023-05-14 DIAGNOSIS — E1122 Type 2 diabetes mellitus with diabetic chronic kidney disease: Secondary | ICD-10-CM | POA: Diagnosis not present

## 2023-05-14 DIAGNOSIS — K219 Gastro-esophageal reflux disease without esophagitis: Secondary | ICD-10-CM | POA: Diagnosis not present

## 2023-05-14 DIAGNOSIS — Z008 Encounter for other general examination: Secondary | ICD-10-CM | POA: Diagnosis not present

## 2023-05-14 DIAGNOSIS — N182 Chronic kidney disease, stage 2 (mild): Secondary | ICD-10-CM | POA: Diagnosis not present

## 2023-05-14 DIAGNOSIS — E785 Hyperlipidemia, unspecified: Secondary | ICD-10-CM | POA: Diagnosis not present

## 2023-05-14 DIAGNOSIS — F419 Anxiety disorder, unspecified: Secondary | ICD-10-CM | POA: Diagnosis not present

## 2023-05-14 DIAGNOSIS — F325 Major depressive disorder, single episode, in full remission: Secondary | ICD-10-CM | POA: Diagnosis not present

## 2023-05-14 DIAGNOSIS — J301 Allergic rhinitis due to pollen: Secondary | ICD-10-CM | POA: Diagnosis not present

## 2023-05-14 DIAGNOSIS — M1990 Unspecified osteoarthritis, unspecified site: Secondary | ICD-10-CM | POA: Diagnosis not present

## 2023-05-14 DIAGNOSIS — E1151 Type 2 diabetes mellitus with diabetic peripheral angiopathy without gangrene: Secondary | ICD-10-CM | POA: Diagnosis not present

## 2023-05-14 DIAGNOSIS — N400 Enlarged prostate without lower urinary tract symptoms: Secondary | ICD-10-CM | POA: Diagnosis not present

## 2023-05-14 DIAGNOSIS — I129 Hypertensive chronic kidney disease with stage 1 through stage 4 chronic kidney disease, or unspecified chronic kidney disease: Secondary | ICD-10-CM | POA: Diagnosis not present

## 2023-07-13 DIAGNOSIS — I10 Essential (primary) hypertension: Secondary | ICD-10-CM | POA: Diagnosis not present

## 2023-07-13 DIAGNOSIS — J301 Allergic rhinitis due to pollen: Secondary | ICD-10-CM | POA: Diagnosis not present

## 2023-07-13 DIAGNOSIS — G4733 Obstructive sleep apnea (adult) (pediatric): Secondary | ICD-10-CM | POA: Diagnosis not present

## 2023-07-13 DIAGNOSIS — M1712 Unilateral primary osteoarthritis, left knee: Secondary | ICD-10-CM | POA: Diagnosis not present

## 2023-07-13 DIAGNOSIS — I700 Atherosclerosis of aorta: Secondary | ICD-10-CM | POA: Diagnosis not present

## 2023-07-13 DIAGNOSIS — E7849 Other hyperlipidemia: Secondary | ICD-10-CM | POA: Diagnosis not present

## 2023-07-13 DIAGNOSIS — M545 Low back pain, unspecified: Secondary | ICD-10-CM | POA: Diagnosis not present

## 2023-07-13 DIAGNOSIS — E1165 Type 2 diabetes mellitus with hyperglycemia: Secondary | ICD-10-CM | POA: Diagnosis not present

## 2023-07-13 DIAGNOSIS — E782 Mixed hyperlipidemia: Secondary | ICD-10-CM | POA: Diagnosis not present

## 2023-07-14 DIAGNOSIS — E119 Type 2 diabetes mellitus without complications: Secondary | ICD-10-CM | POA: Diagnosis not present

## 2023-07-14 DIAGNOSIS — Z961 Presence of intraocular lens: Secondary | ICD-10-CM | POA: Diagnosis not present

## 2023-07-14 DIAGNOSIS — H40013 Open angle with borderline findings, low risk, bilateral: Secondary | ICD-10-CM | POA: Diagnosis not present

## 2023-07-16 DIAGNOSIS — Z23 Encounter for immunization: Secondary | ICD-10-CM | POA: Diagnosis not present

## 2023-07-24 DIAGNOSIS — M1712 Unilateral primary osteoarthritis, left knee: Secondary | ICD-10-CM | POA: Diagnosis not present

## 2023-07-28 DIAGNOSIS — R1084 Generalized abdominal pain: Secondary | ICD-10-CM | POA: Diagnosis not present

## 2023-07-28 DIAGNOSIS — R350 Frequency of micturition: Secondary | ICD-10-CM | POA: Diagnosis not present

## 2023-09-04 DIAGNOSIS — E782 Mixed hyperlipidemia: Secondary | ICD-10-CM | POA: Diagnosis not present

## 2023-09-04 DIAGNOSIS — I10 Essential (primary) hypertension: Secondary | ICD-10-CM | POA: Diagnosis not present

## 2023-09-04 DIAGNOSIS — E1142 Type 2 diabetes mellitus with diabetic polyneuropathy: Secondary | ICD-10-CM | POA: Diagnosis not present

## 2023-09-11 DIAGNOSIS — S0003XA Contusion of scalp, initial encounter: Secondary | ICD-10-CM | POA: Diagnosis not present

## 2023-09-11 DIAGNOSIS — S40011A Contusion of right shoulder, initial encounter: Secondary | ICD-10-CM | POA: Diagnosis not present

## 2023-09-28 DIAGNOSIS — Z6824 Body mass index (BMI) 24.0-24.9, adult: Secondary | ICD-10-CM | POA: Diagnosis not present

## 2023-09-28 DIAGNOSIS — R1110 Vomiting, unspecified: Secondary | ICD-10-CM | POA: Diagnosis not present

## 2023-09-28 DIAGNOSIS — R109 Unspecified abdominal pain: Secondary | ICD-10-CM | POA: Diagnosis not present

## 2023-10-05 DIAGNOSIS — I10 Essential (primary) hypertension: Secondary | ICD-10-CM | POA: Diagnosis not present

## 2023-10-05 DIAGNOSIS — E782 Mixed hyperlipidemia: Secondary | ICD-10-CM | POA: Diagnosis not present

## 2023-10-05 DIAGNOSIS — E1142 Type 2 diabetes mellitus with diabetic polyneuropathy: Secondary | ICD-10-CM | POA: Diagnosis not present

## 2023-11-02 DIAGNOSIS — M25511 Pain in right shoulder: Secondary | ICD-10-CM | POA: Diagnosis not present

## 2023-11-04 DIAGNOSIS — I10 Essential (primary) hypertension: Secondary | ICD-10-CM | POA: Diagnosis not present

## 2023-11-04 DIAGNOSIS — E1142 Type 2 diabetes mellitus with diabetic polyneuropathy: Secondary | ICD-10-CM | POA: Diagnosis not present

## 2023-11-04 DIAGNOSIS — E782 Mixed hyperlipidemia: Secondary | ICD-10-CM | POA: Diagnosis not present

## 2023-11-06 DIAGNOSIS — E7849 Other hyperlipidemia: Secondary | ICD-10-CM | POA: Diagnosis not present

## 2023-11-06 DIAGNOSIS — N182 Chronic kidney disease, stage 2 (mild): Secondary | ICD-10-CM | POA: Diagnosis not present

## 2023-11-06 DIAGNOSIS — E1122 Type 2 diabetes mellitus with diabetic chronic kidney disease: Secondary | ICD-10-CM | POA: Diagnosis not present

## 2023-11-06 DIAGNOSIS — N189 Chronic kidney disease, unspecified: Secondary | ICD-10-CM | POA: Diagnosis not present

## 2023-11-06 DIAGNOSIS — E039 Hypothyroidism, unspecified: Secondary | ICD-10-CM | POA: Diagnosis not present

## 2023-11-06 DIAGNOSIS — I10 Essential (primary) hypertension: Secondary | ICD-10-CM | POA: Diagnosis not present

## 2023-11-06 DIAGNOSIS — E1165 Type 2 diabetes mellitus with hyperglycemia: Secondary | ICD-10-CM | POA: Diagnosis not present

## 2023-11-10 DIAGNOSIS — I10 Essential (primary) hypertension: Secondary | ICD-10-CM | POA: Diagnosis not present

## 2023-11-10 DIAGNOSIS — E7849 Other hyperlipidemia: Secondary | ICD-10-CM | POA: Diagnosis not present

## 2023-11-10 DIAGNOSIS — E782 Mixed hyperlipidemia: Secondary | ICD-10-CM | POA: Diagnosis not present

## 2023-11-10 DIAGNOSIS — E1142 Type 2 diabetes mellitus with diabetic polyneuropathy: Secondary | ICD-10-CM | POA: Diagnosis not present

## 2023-11-16 DIAGNOSIS — M25511 Pain in right shoulder: Secondary | ICD-10-CM | POA: Diagnosis not present

## 2023-12-04 DIAGNOSIS — E1142 Type 2 diabetes mellitus with diabetic polyneuropathy: Secondary | ICD-10-CM | POA: Diagnosis not present

## 2023-12-04 DIAGNOSIS — E782 Mixed hyperlipidemia: Secondary | ICD-10-CM | POA: Diagnosis not present

## 2023-12-04 DIAGNOSIS — I10 Essential (primary) hypertension: Secondary | ICD-10-CM | POA: Diagnosis not present

## 2023-12-08 DIAGNOSIS — M170 Bilateral primary osteoarthritis of knee: Secondary | ICD-10-CM | POA: Diagnosis not present

## 2023-12-08 DIAGNOSIS — M1712 Unilateral primary osteoarthritis, left knee: Secondary | ICD-10-CM | POA: Diagnosis not present

## 2023-12-08 DIAGNOSIS — M5416 Radiculopathy, lumbar region: Secondary | ICD-10-CM | POA: Diagnosis not present

## 2023-12-25 DIAGNOSIS — M1712 Unilateral primary osteoarthritis, left knee: Secondary | ICD-10-CM | POA: Diagnosis not present

## 2024-01-01 DIAGNOSIS — M1712 Unilateral primary osteoarthritis, left knee: Secondary | ICD-10-CM | POA: Diagnosis not present

## 2024-01-04 DIAGNOSIS — E1142 Type 2 diabetes mellitus with diabetic polyneuropathy: Secondary | ICD-10-CM | POA: Diagnosis not present

## 2024-01-04 DIAGNOSIS — I10 Essential (primary) hypertension: Secondary | ICD-10-CM | POA: Diagnosis not present

## 2024-01-04 DIAGNOSIS — E782 Mixed hyperlipidemia: Secondary | ICD-10-CM | POA: Diagnosis not present

## 2024-01-07 DIAGNOSIS — M1712 Unilateral primary osteoarthritis, left knee: Secondary | ICD-10-CM | POA: Diagnosis not present

## 2024-02-04 DIAGNOSIS — I10 Essential (primary) hypertension: Secondary | ICD-10-CM | POA: Diagnosis not present

## 2024-02-04 DIAGNOSIS — E782 Mixed hyperlipidemia: Secondary | ICD-10-CM | POA: Diagnosis not present

## 2024-02-04 DIAGNOSIS — E1142 Type 2 diabetes mellitus with diabetic polyneuropathy: Secondary | ICD-10-CM | POA: Diagnosis not present

## 2024-02-18 DIAGNOSIS — R1084 Generalized abdominal pain: Secondary | ICD-10-CM | POA: Diagnosis not present

## 2024-02-18 DIAGNOSIS — K5732 Diverticulitis of large intestine without perforation or abscess without bleeding: Secondary | ICD-10-CM | POA: Diagnosis not present

## 2024-03-01 DIAGNOSIS — R109 Unspecified abdominal pain: Secondary | ICD-10-CM | POA: Diagnosis not present

## 2024-03-01 DIAGNOSIS — N2889 Other specified disorders of kidney and ureter: Secondary | ICD-10-CM | POA: Diagnosis not present

## 2024-03-01 DIAGNOSIS — N200 Calculus of kidney: Secondary | ICD-10-CM | POA: Diagnosis not present

## 2024-03-04 DIAGNOSIS — I10 Essential (primary) hypertension: Secondary | ICD-10-CM | POA: Diagnosis not present

## 2024-03-04 DIAGNOSIS — E782 Mixed hyperlipidemia: Secondary | ICD-10-CM | POA: Diagnosis not present

## 2024-03-04 DIAGNOSIS — E1142 Type 2 diabetes mellitus with diabetic polyneuropathy: Secondary | ICD-10-CM | POA: Diagnosis not present

## 2024-03-21 DIAGNOSIS — E039 Hypothyroidism, unspecified: Secondary | ICD-10-CM | POA: Diagnosis not present

## 2024-03-21 DIAGNOSIS — D529 Folate deficiency anemia, unspecified: Secondary | ICD-10-CM | POA: Diagnosis not present

## 2024-03-21 DIAGNOSIS — N182 Chronic kidney disease, stage 2 (mild): Secondary | ICD-10-CM | POA: Diagnosis not present

## 2024-03-21 DIAGNOSIS — E7849 Other hyperlipidemia: Secondary | ICD-10-CM | POA: Diagnosis not present

## 2024-03-21 DIAGNOSIS — E1142 Type 2 diabetes mellitus with diabetic polyneuropathy: Secondary | ICD-10-CM | POA: Diagnosis not present

## 2024-03-29 DIAGNOSIS — K5750 Diverticulosis of both small and large intestine without perforation or abscess without bleeding: Secondary | ICD-10-CM | POA: Diagnosis not present

## 2024-03-29 DIAGNOSIS — N2889 Other specified disorders of kidney and ureter: Secondary | ICD-10-CM | POA: Diagnosis not present

## 2024-03-29 DIAGNOSIS — N281 Cyst of kidney, acquired: Secondary | ICD-10-CM | POA: Diagnosis not present

## 2024-03-30 DIAGNOSIS — Z1389 Encounter for screening for other disorder: Secondary | ICD-10-CM | POA: Diagnosis not present

## 2024-03-30 DIAGNOSIS — Z0001 Encounter for general adult medical examination with abnormal findings: Secondary | ICD-10-CM | POA: Diagnosis not present

## 2024-03-30 DIAGNOSIS — E782 Mixed hyperlipidemia: Secondary | ICD-10-CM | POA: Diagnosis not present

## 2024-03-30 DIAGNOSIS — I10 Essential (primary) hypertension: Secondary | ICD-10-CM | POA: Diagnosis not present

## 2024-03-30 DIAGNOSIS — Z23 Encounter for immunization: Secondary | ICD-10-CM | POA: Diagnosis not present

## 2024-03-30 DIAGNOSIS — E1122 Type 2 diabetes mellitus with diabetic chronic kidney disease: Secondary | ICD-10-CM | POA: Diagnosis not present

## 2024-04-05 DIAGNOSIS — I10 Essential (primary) hypertension: Secondary | ICD-10-CM | POA: Diagnosis not present

## 2024-04-05 DIAGNOSIS — E782 Mixed hyperlipidemia: Secondary | ICD-10-CM | POA: Diagnosis not present

## 2024-04-05 DIAGNOSIS — E1142 Type 2 diabetes mellitus with diabetic polyneuropathy: Secondary | ICD-10-CM | POA: Diagnosis not present
